# Patient Record
Sex: Female | Born: 1960 | Race: White | Hispanic: No | Marital: Married | Smoking: Never smoker
Health system: Southern US, Community
[De-identification: ages and names within clinical notes are randomized; demographics above are authoritative.]

## PROBLEM LIST (undated history)

## (undated) DIAGNOSIS — K649 Unspecified hemorrhoids: Secondary | ICD-10-CM

## (undated) DIAGNOSIS — B019 Varicella without complication: Secondary | ICD-10-CM

## (undated) DIAGNOSIS — R87610 Atypical squamous cells of undetermined significance on cytologic smear of cervix (ASC-US): Secondary | ICD-10-CM

## (undated) DIAGNOSIS — M1990 Unspecified osteoarthritis, unspecified site: Secondary | ICD-10-CM

## (undated) DIAGNOSIS — O24419 Gestational diabetes mellitus in pregnancy, unspecified control: Secondary | ICD-10-CM

## (undated) DIAGNOSIS — G43909 Migraine, unspecified, not intractable, without status migrainosus: Secondary | ICD-10-CM

## (undated) DIAGNOSIS — Z9289 Personal history of other medical treatment: Secondary | ICD-10-CM

## (undated) DIAGNOSIS — Z98891 History of uterine scar from previous surgery: Secondary | ICD-10-CM

## (undated) DIAGNOSIS — N839 Noninflammatory disorder of ovary, fallopian tube and broad ligament, unspecified: Secondary | ICD-10-CM

## (undated) DIAGNOSIS — N926 Irregular menstruation, unspecified: Secondary | ICD-10-CM

## (undated) HISTORY — DX: Unspecified hemorrhoids: K64.9

## (undated) HISTORY — DX: Migraine, unspecified, not intractable, without status migrainosus: G43.909

## (undated) HISTORY — DX: Noninflammatory disorder of ovary, fallopian tube and broad ligament, unspecified: N83.9

## (undated) HISTORY — DX: Varicella without complication: B01.9

## (undated) HISTORY — DX: Atypical squamous cells of undetermined significance on cytologic smear of cervix (ASC-US): R87.610

## (undated) HISTORY — DX: History of uterine scar from previous surgery: Z98.891

## (undated) HISTORY — DX: Irregular menstruation, unspecified: N92.6

## (undated) HISTORY — DX: Gestational diabetes mellitus in pregnancy, unspecified control: O24.419

## (undated) HISTORY — DX: Personal history of other medical treatment: Z92.89

## (undated) HISTORY — DX: Unspecified osteoarthritis, unspecified site: M19.90

---

## 2000-08-20 HISTORY — PX: TUBAL LIGATION: SHX77

## 2011-08-21 DIAGNOSIS — N839 Noninflammatory disorder of ovary, fallopian tube and broad ligament, unspecified: Secondary | ICD-10-CM

## 2011-08-21 HISTORY — DX: Noninflammatory disorder of ovary, fallopian tube and broad ligament, unspecified: N83.9

## 2012-01-02 ENCOUNTER — Ambulatory Visit: Payer: Self-pay

## 2012-06-24 ENCOUNTER — Ambulatory Visit: Payer: Self-pay | Admitting: Obstetrics and Gynecology

## 2012-06-24 LAB — CBC
HGB: 12.6 g/dL (ref 12.0–16.0)
MCH: 33 pg (ref 26.0–34.0)
MCV: 95 fL (ref 80–100)
Platelet: 263 10*3/uL (ref 150–440)
RBC: 3.83 10*6/uL (ref 3.80–5.20)

## 2012-06-24 LAB — BASIC METABOLIC PANEL
Anion Gap: 6 — ABNORMAL LOW (ref 7–16)
BUN: 20 mg/dL — ABNORMAL HIGH (ref 7–18)
Chloride: 109 mmol/L — ABNORMAL HIGH (ref 98–107)
Co2: 27 mmol/L (ref 21–32)
Creatinine: 0.68 mg/dL (ref 0.60–1.30)
EGFR (Non-African Amer.): >60
Osmolality: 285 (ref 275–301)
Potassium: 3.8 mmol/L (ref 3.5–5.1)
Sodium: 142 mmol/L (ref 136–145)

## 2012-07-01 ENCOUNTER — Ambulatory Visit: Payer: Self-pay | Admitting: Obstetrics and Gynecology

## 2012-07-01 HISTORY — PX: ABDOMINAL HYSTERECTOMY: SHX81

## 2012-07-02 LAB — BASIC METABOLIC PANEL
Chloride: 110 mmol/L — ABNORMAL HIGH (ref 98–107)
Co2: 25 mmol/L (ref 21–32)
Creatinine: 0.74 mg/dL (ref 0.60–1.30)
EGFR (Non-African Amer.): >60
Glucose: 97 mg/dL (ref 65–99)
Potassium: 3.4 mmol/L — ABNORMAL LOW (ref 3.5–5.1)
Sodium: 141 mmol/L (ref 136–145)

## 2012-07-02 LAB — HEMATOCRIT: HCT: 32 % — ABNORMAL LOW (ref 35.0–47.0)

## 2013-06-20 DIAGNOSIS — K649 Unspecified hemorrhoids: Secondary | ICD-10-CM

## 2013-06-20 HISTORY — DX: Unspecified hemorrhoids: K64.9

## 2013-06-20 HISTORY — PX: COLONOSCOPY: SHX174

## 2014-04-29 DIAGNOSIS — R87610 Atypical squamous cells of undetermined significance on cytologic smear of cervix (ASC-US): Secondary | ICD-10-CM

## 2014-04-29 HISTORY — DX: Atypical squamous cells of undetermined significance on cytologic smear of cervix (ASC-US): R87.610

## 2014-12-07 NOTE — Op Note (Signed)
PATIENT NAME:  Kelly Welch, Kelly Welch MR#:  [id number] DATE OF BIRTH:  February 01, 1961  DATE OF PROCEDURE:  07/01/2012  PREOPERATIVE DIAGNOSIS: Severe pain and bleeding unresponsive to medical management.   POSTOPERATIVE DIAGNOSES:  1. Severe pain and bleeding unresponsive to medical management. 2. Adhesions on the left lateral sidewall.   ESTIMATED BLOOD LOSS: 100 mL.   SURGEON: Elliot Gurney, MD  ASSISTANT: Annamarie Major, M.D.   FINDINGS: Approximately 12 week size uterus, spongy, with bilateral tubal ligation with the left tubal ligation wrapped around the ovary and adhered to the left side wall with what appeared to be old endometriosis scarring on the left lateral uterosacral ligaments.   DESCRIPTION OF PROCEDURE: Patient was taken to the Operating Room and placed in supine position. After adequate general endotracheal anesthesia was instilled, the patient was prepped and draped in the usual sterile fashion. Timeout was performed. Side-opening speculum was placed in the patient's vagina and the anterior lip of the cervix was grasped with a single-tooth tenaculum. Hulka tenaculum was placed. Single-tooth tenaculum was removed. Side-opening speculum was removed. Foley catheter had been placed. The umbilicus was injected with Marcaine. Incision was made through the umbilical folds. Veress needle was placed. Hang drop test, fluid instillation test and fluid aspiration test showed proper placement of the Veress needle. CO2 was placed on low flow. When tympany was heard around the liver CO2 was placed on high flow. VERESS needle was removed when 15 mmHg was placed into the abdomen and the Xcel trocar was placed into the umbilicus under direct visualization. The patient was placed in Trendelenburg and the two lower port 10 mm trocars were placed. The aforementioned findings were seen. Photographs were taken. 10 mm trocar ports were placed bilaterally and Harmonic scalpel was placed into the abdomen. The grasper  was used to hold back the tube. First the left tube was released from the ovary and the sidewall and then serial bites were taken down the sides of the uterus, the utero-ovarian ligament to the round ligament down to the bladder. Bladder flap was created. The Kleppinger was then used to cauterize the uterine artery. Bladder flap was created. There was excessive scarring and fat tissue from the previous C-section up onto the bladder and the side walls of the left side of the uterus. Attention was then turned to the right side where the tube was detached from the peritoneum. The utero-ovarian ligament was cut with serial bites of the Harmonic scalpel. The round ligament was cut. Serial bites taken down the side of the right uterus. Kleppinger was used to cauterize the right uterine artery. The bladder flap was made a little bit more pronounced and pushed off of the cervix approximately 1 cm. The uterosacral ligaments are identified and the Harmonic scalpel was then used to cut across the cervix above the uterosacral ligament for support. Good hemostasis was identified. One small area of bleeding was identified in the left area of the bladder attachment to the fat. Sponge stick was placed in the vagina and the vagina was pushed up to identify the source of the bleeding. This was then cauterized with the Kleppinger. Indigo carmine was given to the patient. The uterus was then morcellated. All pieces were removed from the abdomen. The pelvis was irrigated with copious amounts of warm normal saline. Good hemostasis identified. The cervical opening was cauterized with the Kleppinger. Interceed was then placed across the base of the cervix. The patient was taken out of Trendelenburg. Bowels were allowed to  fall back into the pelvis and hold the Interceed in place. CO2 was allowed to escape from the patient's abdomen after the cone had been used to tie the fascial suture on the patient's left side where the morcellator had  gone to. UR-6 were used for the deep suture of the fascia to approximate the other incisions. A 4-0 Monocryl was used to approximate the skin edges. Dermabond was placed. Tegaderm was placed. Sponge stick was removed from the patient's vagina with the other instruments. Blue urine was noted in the Foley bag and not in the belly. The patient was then laid supine and taken to recovery after having tolerated the procedure well.   ____________________________ Elliot Gurneyarrie C. Earnstine Meinders, MD cck:cms D: 07/05/2012 12:26:15 ET T: 07/05/2012 14:12:53 ET JOB#: [id number]  cc: Elliot Gurneyarrie C. Anthonyjames Bargar, MD, <Dictator> Elliot GurneyARRIE C Einar Nolasco MD ELECTRONICALLY SIGNED 07/10/2012 15:46

## 2016-08-28 ENCOUNTER — Other Ambulatory Visit: Payer: Self-pay | Admitting: Obstetrics and Gynecology

## 2016-08-28 DIAGNOSIS — Z1231 Encounter for screening mammogram for malignant neoplasm of breast: Secondary | ICD-10-CM

## 2016-10-01 ENCOUNTER — Ambulatory Visit: Payer: Self-pay

## 2016-11-13 ENCOUNTER — Ambulatory Visit
Admission: RE | Admit: 2016-11-13 | Discharge: 2016-11-13 | Disposition: A | Discharge status: Home / Self Care | Payer: Managed Care, Other (non HMO) | Source: Ambulatory Visit | Attending: Obstetrics and Gynecology | Admitting: Obstetrics and Gynecology

## 2016-11-13 DIAGNOSIS — Z1231 Encounter for screening mammogram for malignant neoplasm of breast: Secondary | ICD-10-CM | POA: Diagnosis present

## 2016-11-15 ENCOUNTER — Inpatient Hospital Stay
Admission: RE | Admit: 2016-11-15 | Discharge: 2016-11-15 | Disposition: A | Discharge status: Home / Self Care | Payer: Self-pay | Source: Ambulatory Visit | Attending: *Deleted | Admitting: *Deleted

## 2016-11-15 ENCOUNTER — Other Ambulatory Visit: Payer: Self-pay | Admitting: *Deleted

## 2016-11-15 DIAGNOSIS — Z9289 Personal history of other medical treatment: Secondary | ICD-10-CM

## 2016-11-25 ENCOUNTER — Encounter: Payer: Self-pay | Admitting: Obstetrics and Gynecology

## 2017-04-29 ENCOUNTER — Other Ambulatory Visit: Payer: Self-pay | Admitting: Obstetrics and Gynecology

## 2017-05-15 ENCOUNTER — Other Ambulatory Visit: Payer: Self-pay | Admitting: Obstetrics and Gynecology

## 2017-05-16 ENCOUNTER — Other Ambulatory Visit: Payer: Self-pay | Admitting: Obstetrics and Gynecology

## 2017-05-16 MED ORDER — FLUTICASONE PROPIONATE 50 MCG/ACT NA SUSP: NASAL | 0 refills | Status: DC

## 2017-05-16 NOTE — Progress Notes (Signed)
Reorder since pharm says they didn't get Rx.

## 2017-05-20 ENCOUNTER — Other Ambulatory Visit: Payer: Self-pay

## 2017-05-20 MED ORDER — FLUTICASONE PROPIONATE 50 MCG/ACT NA SUSP: NASAL | 0 refills | Status: DC

## 2017-05-27 ENCOUNTER — Encounter: Payer: Self-pay | Admitting: Obstetrics and Gynecology

## 2017-05-27 ENCOUNTER — Ambulatory Visit (INDEPENDENT_AMBULATORY_CARE_PROVIDER_SITE_OTHER): Payer: Managed Care, Other (non HMO) | Admitting: Obstetrics and Gynecology

## 2017-05-27 VITALS — BP 126/84 | HR 80 | Ht 62.0 in | Wt 186.0 lb

## 2017-05-27 DIAGNOSIS — F419 Anxiety disorder, unspecified: Secondary | ICD-10-CM | POA: Diagnosis not present

## 2017-05-27 MED ORDER — LORAZEPAM 0.5 MG PO TABS: ORAL_TABLET | ORAL | 0 refills | Status: DC

## 2017-05-27 NOTE — Addendum Note (Signed)
Addended by: Althea Grimmer B on: 05/27/2017 12:15 PM   Modules accepted: Level of Service

## 2017-05-27 NOTE — Progress Notes (Addendum)
Chief Complaint  Patient presents with  . Anxiety    HPI:      [name] is a 56 y.o. [phone] who LMP was No LMP recorded. Patient has had a hysterectomy., presents today for recent anxiety due to family health issues. 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 She has trouble sleeping due to worry and taking care of her mom's fragile DM. Sx increased for about a month.  She took prozac intermittently in the past when kids were young, but no real hx of anxiety/depression. Pt is trying to manage sx with prayer/exercise, but just feels overwhelmed some days. She would like something for prn use. No SI.   Past Medical History:  Diagnosis Date  . ASCUS of cervix with negative high risk HPV 04/29/2014  . Fallopian tube disorder 2013   focal epithelial hyperplasia of the tubes on pathology  . Gestational diabetes   . H/O cesarean section 93; 99; 02   x3  . Hemorrhoids 06/2013   internal per colonoscopy  . History of mammogram 9/15/15l 07/20/15   birad 2; benign  . History of Papanicolaou smear of cervix 04/29/14; 07/20/15   ascus/neg; neg/neg  . Irregular menses   . Migraine     Past Surgical History:  Procedure Laterality Date  . ABDOMINAL HYSTERECTOMY  07/01/2012   LSH C BIL TUBES REMOVED  . CESAREAN SECTION     93; 99; 02  . COLONOSCOPY  06/2013   DR. ELLIOTT; INTERNAL HEMORRHOIDS; RECK IN 5HRS D/T FAM HX  . TUBAL LIGATION  2002    Family History  Problem Relation Age of Onset  . Diabetes Mother   . Hypertension Mother   . Diabetes Father   . Heart disease Father   . Hypertension Father     Social History   Social History  . Marital status: Married    Spouse name: N/A  . Number of children: 3  . Years of education: 14   Occupational History  . HOMEMAKER    Social History Main Topics  . Smoking status: Never Smoker  . Smokeless tobacco: Never Used  . Alcohol use Yes   Comment: OCC  . Drug use: No  . Sexual activity: Yes    Birth control/ protection: Post-menopausal   Other Topics Concern  . Not on file   Social History Narrative  . No narrative on file     Current Outpatient Prescriptions:  .  Cholecalciferol (VITAMIN D3) 2000 units TABS, Take 1 tablet by mouth daily., Disp: , Rfl:  .  estradiol (VIVELLE-DOT) 0.0375 MG/24HR, APPLY ONE PATCH TWICE WEEKLY, Disp: 24 patch, Rfl: 1 .  fluticasone (FLONASE) 50 MCG/ACT nasal spray, USE 2 SPRAYS IN EACH NOSTRIL DAILY AS NEEDED, Disp: 48 g, Rfl: 0 .  hydrochlorothiazide (HYDRODIURIL) 25 MG tablet, Take 25 mg by mouth daily., Disp: , Rfl:  .  SUPER B COMPLEX/C PO, Take by mouth., Disp: , Rfl:  .  LORazepam (ATIVAN) 0.5 MG tablet, Take 1-2 tabs BID prn sx, Disp: 30 tablet, Rfl: 0   ROS:  Review of Systems  Constitutional: Negative for fever.  Gastrointestinal: Positive for nausea. Negative for blood in stool, constipation, diarrhea and vomiting.  Genitourinary: Negative for dyspareunia, dysuria, flank pain, frequency, hematuria, urgency, vaginal bleeding, vaginal discharge and vaginal pain.  Musculoskeletal: Negative for back pain.  Skin: Negative for rash.  Neurological: Positive for headaches.  Psychiatric/Behavioral:  Positive for agitation and sleep disturbance. Negative for dysphoric mood, hallucinations, self-injury and suicidal ideas.     OBJECTIVE:   Vitals:  BP 126/84 (BP Location: Left Arm, Patient Position: Sitting, Cuff Size: Large)   Pulse 80   Ht  (1.575 m)   Wt 186 lb (84.4 kg)   BMI 34.02 kg/m   Physical Exam  Constitutional: She is oriented to person, place, and time and well-developed, well-nourished, and in no distress.  Neurological: She is alert and oriented to person, place, and time.  Psychiatric: Memory, affect and judgment normal.  Vitals reviewed.   Results:  GAD 7 : Generalized Anxiety Score 05/27/2017  Nervous, Anxious, on Edge 1  Control/stop worrying 2    Worry too much - different things 2  Trouble relaxing 1  Restless 1  Easily annoyed or irritable 1  Afraid - awful might happen 2  Total GAD 7 Score 10  Anxiety Difficulty Somewhat difficult   Depression screen PHQ 2/9 05/27/2017  Decreased Interest 0  Down, Depressed, Hopeless 1  PHQ - 2 Score 1  Altered sleeping 2  Tired, decreased energy 1  Change in appetite 1  Feeling bad or failure about yourself  0  Trouble concentrating 1  Moving slowly or fidgety/restless 0  Suicidal thoughts 0  PHQ-9 Score 6  Difficult doing work/chores Somewhat difficult    Assessment/Plan: Anxiety - Increased recently, due to current stressors. Rx ativan. Use sparingly. F/u prn. Cont exercise/prayer. - Plan: LORazepam (ATIVAN) 0.5 MG tablet  Annual due 12/18--can f/u then, too.  Meds ordered this encounter  Medications  . LORazepam (ATIVAN) 0.5 MG tablet    Sig: Take 1-2 tabs BID prn sx    Dispense:  30 tablet    Refill:  0      Return if symptoms worsen or fail to improve.  Orien Mayhall B. Cam Dauphin, PA-C 05/27/2017 12:15 PM

## 2017-07-31 ENCOUNTER — Other Ambulatory Visit: Payer: Self-pay | Admitting: Obstetrics and Gynecology

## 2017-08-28 ENCOUNTER — Ambulatory Visit (INDEPENDENT_AMBULATORY_CARE_PROVIDER_SITE_OTHER): Payer: Managed Care, Other (non HMO) | Admitting: Obstetrics and Gynecology

## 2017-08-28 ENCOUNTER — Encounter: Payer: Self-pay | Admitting: Obstetrics and Gynecology

## 2017-08-28 VITALS — BP 130/82 | HR 62 | Ht 62.0 in | Wt 192.0 lb

## 2017-08-28 DIAGNOSIS — Z1239 Encounter for other screening for malignant neoplasm of breast: Secondary | ICD-10-CM

## 2017-08-28 DIAGNOSIS — Z9109 Other allergy status, other than to drugs and biological substances: Secondary | ICD-10-CM | POA: Diagnosis not present

## 2017-08-28 DIAGNOSIS — G8929 Other chronic pain: Secondary | ICD-10-CM

## 2017-08-28 DIAGNOSIS — Z1231 Encounter for screening mammogram for malignant neoplasm of breast: Secondary | ICD-10-CM

## 2017-08-28 DIAGNOSIS — Z79890 Hormone replacement therapy (postmenopausal): Secondary | ICD-10-CM | POA: Diagnosis not present

## 2017-08-28 DIAGNOSIS — M5442 Lumbago with sciatica, left side: Secondary | ICD-10-CM

## 2017-08-28 DIAGNOSIS — Z1211 Encounter for screening for malignant neoplasm of colon: Secondary | ICD-10-CM

## 2017-08-28 DIAGNOSIS — Z13220 Encounter for screening for lipoid disorders: Secondary | ICD-10-CM | POA: Diagnosis not present

## 2017-08-28 DIAGNOSIS — R600 Localized edema: Secondary | ICD-10-CM

## 2017-08-28 DIAGNOSIS — Z131 Encounter for screening for diabetes mellitus: Secondary | ICD-10-CM | POA: Diagnosis not present

## 2017-08-28 DIAGNOSIS — Z01419 Encounter for gynecological examination (general) (routine) without abnormal findings: Secondary | ICD-10-CM

## 2017-08-28 DIAGNOSIS — N951 Menopausal and female climacteric states: Secondary | ICD-10-CM

## 2017-08-28 DIAGNOSIS — Z0000 Encounter for general adult medical examination without abnormal findings: Secondary | ICD-10-CM | POA: Diagnosis not present

## 2017-08-28 DIAGNOSIS — F419 Anxiety disorder, unspecified: Secondary | ICD-10-CM | POA: Diagnosis not present

## 2017-08-28 MED ORDER — FLUTICASONE PROPIONATE 50 MCG/ACT NA SUSP: 2.0000 | Freq: Every day | NASAL | 1 refills | Status: DC

## 2017-08-28 MED ORDER — ESTRADIOL 0.0375 MG/24HR TD PTTW: MEDICATED_PATCH | TRANSDERMAL | 4 refills | Status: DC

## 2017-08-28 MED ORDER — HYDROCHLOROTHIAZIDE 25 MG PO TABS: ORAL_TABLET | ORAL | 3 refills | Status: DC

## 2017-08-28 NOTE — Progress Notes (Signed)
PCP: Patient, No Pcp Per   Chief Complaint  Patient presents with  . Gynecologic Exam    HPI:      [name] is a 57 y.o. [phone] who LMP was No LMP recorded. Patient has had a hysterectomy., presents today for her annual examination.  Her menses are absent due to [facility] with bilat salpingectomy 2013 with CCK due to DUB. She does not have intermenstrual bleeding.  She does not have vasomotor sx. She uses vivelle dot 0.0375 mg with sx relief. She would like to cont ERT.  Sex activity: single partner, contraception - status post hysterectomy. She does not have vaginal dryness.  Last Pap: July 20, 2015  Results were: no abnormalities /neg HPV DNA.  Hx of STDs: none  Last mammogram: November 13, 2016  Results were: normal--routine follow-up in 12 months There is no FH of breast cancer. There is no FH of ovarian cancer. The patient does do self-breast exams.  Colonoscopy: colonoscopy 5 years ago without abnormalities. .Repeat due after 5 years due to FH of polyps.   Tobacco use: The patient denies current or previous tobacco use. Alcohol use: social drinker Exercise: moderately active  She does get adequate calcium but stopped her Vitamin D supp in her diet.  Borderline lipids 2016 and 12/17. Pre-DM on 12/17 labs (HgA1C=5.7%), hx of gestational DM. Due for repeat lab this yr.   Pt also with anxiety issues, seen 10/18 for increased sx due to family health stressors. 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 Pt didn't need daily meds at that time and was given ativan. She never took it. Her family is saying she is overly anxious with worry and "knit picking" sometimes. Pt doesn't think it's daily and is trying to work on it.  She needs Rx RF on flonase for allergies and HCTZ for LE edema. She has been on that for many yrs. She takes 25 mg daily but I suggested last yr she try 1/2 tab since on lower ERT dose now. She exercises regularly.  She notes LBP with LT leg pain  and RT great toe pain. She was seeing chiro for back pain with sx relief but got too expensive. She is using ice/NSAIDs for toe.  Doesn't have PCP.   Past Medical History:  Diagnosis Date  . ASCUS of cervix with negative high risk HPV 04/29/2014  . Fallopian tube disorder 2013   focal epithelial hyperplasia of the tubes on pathology  . Gestational diabetes   . H/O cesarean section 93; 99; 02   x3  . Hemorrhoids 06/2013   internal per colonoscopy  . History of mammogram 9/15/15l 07/20/15   birad 2; benign  . History of Papanicolaou smear of cervix 04/29/14; 07/20/15   ascus/neg; neg/neg  . Irregular menses   . Migraine     Past Surgical History:  Procedure Laterality Date  . ABDOMINAL HYSTERECTOMY  07/01/2012   LSH C BIL TUBES REMOVED  . CESAREAN SECTION     93; 99; 02  . COLONOSCOPY  06/2013   DR. ELLIOTT; INTERNAL HEMORRHOIDS; RECK IN 5HRS D/T FAM HX  . TUBAL LIGATION  2002    Family History  Problem Relation Age of Onset  . Diabetes Mother   . Hypertension Mother   . Diabetes Father   . Heart disease Father   . Hypertension Father     Social History   Socioeconomic History  . Marital status: Married    Spouse name:  Not on file  . Number of children: 3  . Years of education: 41  . Highest education level: Not on file  Social Needs  . Financial resource strain: Not on file  . Food insecurity - worry: Not on file  . Food insecurity - inability: Not on file  . Transportation needs - medical: Not on file  . Transportation needs - non-medical: Not on file  Occupational History  . Occupation: HOMEMAKER  Tobacco Use  . Smoking status: Never Smoker  . Smokeless tobacco: Never Used  Substance and Sexual Activity  . Alcohol use: Yes    Comment: OCC  . Drug use: No  . Sexual activity: Yes    Birth control/protection: Post-menopausal  Other Topics Concern  . Not on file  Social History Narrative  . Not on file    Current Meds  Medication Sig  . estradiol  (VIVELLE-DOT) 0.0375 MG/24HR APPLY ONE PATCH TWICE WEEKLY  . fluticasone (FLONASE) 50 MCG/ACT nasal spray Place 2 sprays into both nostrils daily.  . hydrochlorothiazide (HYDRODIURIL) 25 MG tablet Take 1/2 to 1 tab daily  . [DISCONTINUED] estradiol (VIVELLE-DOT) 0.0375 MG/24HR APPLY ONE PATCH TWICE WEEKLY  . [DISCONTINUED] fluticasone (FLONASE) 50 MCG/ACT nasal spray USE 2 SPRAYS IN EACH NOSTRIL DAILY AS NEEDED  . [DISCONTINUED] hydrochlorothiazide (HYDRODIURIL) 25 MG tablet Take 25 mg by mouth daily.      ROS:  Review of Systems  Constitutional: Negative for fatigue, fever and unexpected weight change.  Respiratory: Negative for cough, shortness of breath and wheezing.   Cardiovascular: Negative for chest pain, palpitations and leg swelling.  Gastrointestinal: Negative for blood in stool, constipation, diarrhea, nausea and vomiting.  Endocrine: Negative for cold intolerance, heat intolerance and polyuria.  Genitourinary: Negative for dyspareunia, dysuria, flank pain, frequency, genital sores, hematuria, menstrual problem, pelvic pain, urgency, vaginal bleeding, vaginal discharge and vaginal pain.  Musculoskeletal: Positive for arthralgias and back pain. Negative for joint swelling and myalgias.  Skin: Negative for rash.  Neurological: Negative for dizziness, syncope, light-headedness, numbness and headaches.  Hematological: Negative for adenopathy.  Psychiatric/Behavioral: Positive for agitation. Negative for confusion, sleep disturbance and suicidal ideas. The patient is not nervous/anxious.      Objective: BP 130/82 (BP Location: Left Arm, Patient Position: Sitting, Cuff Size: Normal)   Pulse 62   Ht 5\' 2"  (1.575 m)   Wt 192 lb (87.1 kg)   BMI 35.12 kg/m    Physical Exam  Constitutional: She is oriented to person, place, and time. She appears well-developed and well-nourished.  Genitourinary: Vagina normal and uterus normal. There is no rash or tenderness on the right labia.  There is no rash or tenderness on the left labia. No erythema or tenderness in the vagina. No vaginal discharge found. Right adnexum does not display mass and does not display tenderness. Left adnexum does not display mass and does not display tenderness. Cervix does not exhibit motion tenderness or polyp. Uterus is not enlarged or tender.  Genitourinary Comments: UTERUS SURG REM  Neck: Normal range of motion. No thyromegaly present.  Cardiovascular: Normal rate, regular rhythm and normal heart sounds.  No murmur heard. Pulmonary/Chest: Effort normal and breath sounds normal. Right breast exhibits no mass, no nipple discharge, no skin change and no tenderness. Left breast exhibits no mass, no nipple discharge, no skin change and no tenderness.  Abdominal: Soft. There is no tenderness. There is no guarding.  Musculoskeletal: Normal range of motion.  Neurological: She is alert and oriented to person, place, and  time. No cranial nerve deficit.  Psychiatric: She has a normal mood and affect. Her behavior is normal.  Vitals reviewed.   Assessment/Plan:  Encounter for annual routine gynecological examination  Screening for breast cancer - Pt to sched mammo. - Plan: MM DIGITAL SCREENING BILATERAL  Blood tests for routine general physical examination - Plan: Comprehensive metabolic panel, Lipid panel, Hemoglobin A1c, Hemoglobin A1c, Lipid panel, Comprehensive metabolic panel  Screening cholesterol level - Plan: Lipid panel, Lipid panel  Screening for diabetes mellitus - Plan: Hemoglobin A1c, Hemoglobin A1c  Hormone replacement therapy (HRT) - Plan: estradiol (VIVELLE-DOT) 0.0375 MG/24HR  Vasomotor symptoms due to menopause - Rx RF vivelle dot 0.0375 mg.  - Plan: estradiol (VIVELLE-DOT) 0.0375 MG/24HR  Chronic bilateral low back pain with left-sided sciatica - Refer to PCP. Stretch/ice/chiro/exercise  Lower leg edema - Rx RF HCTZ. Try 1/2 tab (12.5 mg dose) to see if sx controlled. Cont  exercise/minimize sodium intake. - Plan: hydrochlorothiazide (HYDRODIURIL) 25 MG tablet  Environmental allergies - Rx RF flonase. - Plan: fluticasone (FLONASE) 50 MCG/ACT nasal spray  Anxiety - Try ativan episodically and sparingly. If sx are more daily, will try SSRI. Pt to f/u prn. Cont exercise/prayer.  Screening for colon cancer - Pt due for colonoscopy f/u this yr. Pt to sched wtih DR. Elliott. Will do ref prn.   Meds ordered this encounter  Medications  . estradiol (VIVELLE-DOT) 0.0375 MG/24HR    Sig: APPLY ONE PATCH TWICE WEEKLY    Dispense:  24 patch    Refill:  4  . fluticasone (FLONASE) 50 MCG/ACT nasal spray    Sig: Place 2 sprays into both nostrils daily.    Dispense:  16 g    Refill:  1  . hydrochlorothiazide (HYDRODIURIL) 25 MG tablet    Sig: Take 1/2 to 1 tab daily    Dispense:  90 tablet    Refill:  3           GYN counsel breast self exam, mammography screening, menopause, adequate intake of calcium and vitamin D, diet and exercise    F/U  Return in about 1 year (around 08/28/2018).  Quashaun Lazalde B. Raynetta Osterloh, PA-C 08/28/2017 10:51 AM

## 2017-08-28 NOTE — Patient Instructions (Addendum)
I value your feedback and entrusting us with your care. If you get a [facility] patient survey, I would appreciate you taking the time to let us know about your experience today. Thank you!  [facility]: [phone]    

## 2017-09-06 ENCOUNTER — Other Ambulatory Visit: Payer: Managed Care, Other (non HMO)

## 2017-09-07 LAB — COMPREHENSIVE METABOLIC PANEL
ALBUMIN: 4.7 g/dL (ref 3.5–5.5)
ALT: 15 IU/L (ref 0–32)
AST: 20 IU/L (ref 0–40)
Albumin/Globulin Ratio: 1.7 (ref 1.2–2.2)
Alkaline Phosphatase: 56 IU/L (ref 39–117)
BILIRUBIN TOTAL: 0.5 mg/dL (ref 0.0–1.2)
BUN / CREAT RATIO: 27 — AB (ref 9–23)
BUN: 17 mg/dL (ref 6–24)
CHLORIDE: 100 mmol/L (ref 96–106)
CO2: 23 mmol/L (ref 20–29)
Calcium: 9.9 mg/dL (ref 8.7–10.2)
Creatinine, Ser: 0.64 mg/dL (ref 0.57–1.00)
GFR calc Af Amer: 116 mL/min/{1.73_m2} (ref >59)
GFR calc non Af Amer: 101 mL/min/{1.73_m2} (ref >59)
GLUCOSE: 99 mg/dL (ref 65–99)
Globulin, Total: 2.8 g/dL (ref 1.5–4.5)
Potassium: 4.8 mmol/L (ref 3.5–5.2)
Sodium: 140 mmol/L (ref 134–144)
Total Protein: 7.5 g/dL (ref 6.0–8.5)

## 2017-09-07 LAB — LIPID PANEL
CHOLESTEROL TOTAL: 219 mg/dL — AB (ref 100–199)
Chol/HDL Ratio: 3.2 ratio (ref 0.0–4.4)
HDL: 68 mg/dL (ref >39)
LDL CALC: 129 mg/dL — AB (ref 0–99)
TRIGLYCERIDES: 112 mg/dL (ref 0–149)
VLDL Cholesterol Cal: 22 mg/dL (ref 5–40)

## 2017-09-07 LAB — HEMOGLOBIN A1C
ESTIMATED AVERAGE GLUCOSE: 114 mg/dL
Hgb A1c MFr Bld: 5.6 % (ref 4.8–5.6)

## 2017-10-31 ENCOUNTER — Other Ambulatory Visit: Payer: Self-pay | Admitting: Obstetrics and Gynecology

## 2017-10-31 DIAGNOSIS — Z9109 Other allergy status, other than to drugs and biological substances: Secondary | ICD-10-CM

## 2017-11-19 ENCOUNTER — Ambulatory Visit
Admission: RE | Admit: 2017-11-19 | Discharge: 2017-11-19 | Disposition: A | Discharge status: Home / Self Care | Payer: Managed Care, Other (non HMO) | Source: Ambulatory Visit | Attending: Obstetrics and Gynecology | Admitting: Obstetrics and Gynecology

## 2017-11-19 DIAGNOSIS — Z1239 Encounter for other screening for malignant neoplasm of breast: Secondary | ICD-10-CM

## 2017-11-19 DIAGNOSIS — Z1231 Encounter for screening mammogram for malignant neoplasm of breast: Secondary | ICD-10-CM | POA: Insufficient documentation

## 2017-11-20 ENCOUNTER — Other Ambulatory Visit: Payer: Self-pay | Admitting: Obstetrics and Gynecology

## 2017-11-20 DIAGNOSIS — R928 Other abnormal and inconclusive findings on diagnostic imaging of breast: Secondary | ICD-10-CM

## 2017-11-20 DIAGNOSIS — N6489 Other specified disorders of breast: Secondary | ICD-10-CM

## 2017-11-28 ENCOUNTER — Ambulatory Visit
Admission: RE | Admit: 2017-11-28 | Discharge: 2017-11-28 | Disposition: A | Discharge status: Home / Self Care | Payer: Managed Care, Other (non HMO) | Source: Ambulatory Visit | Attending: Obstetrics and Gynecology | Admitting: Obstetrics and Gynecology

## 2017-11-28 ENCOUNTER — Encounter: Payer: Self-pay | Admitting: Obstetrics and Gynecology

## 2017-11-28 DIAGNOSIS — N6489 Other specified disorders of breast: Secondary | ICD-10-CM | POA: Insufficient documentation

## 2017-11-28 DIAGNOSIS — N6001 Solitary cyst of right breast: Secondary | ICD-10-CM | POA: Diagnosis not present

## 2017-11-28 DIAGNOSIS — R928 Other abnormal and inconclusive findings on diagnostic imaging of breast: Secondary | ICD-10-CM | POA: Diagnosis present

## 2017-12-27 ENCOUNTER — Telehealth: Payer: Self-pay

## 2017-12-27 NOTE — Telephone Encounter (Signed)
Pt calling triage states that she would like her Ativan sent in [facility] pharmacy please.

## 2017-12-30 ENCOUNTER — Other Ambulatory Visit: Payer: Self-pay | Admitting: Obstetrics and Gynecology

## 2017-12-30 DIAGNOSIS — F419 Anxiety disorder, unspecified: Secondary | ICD-10-CM

## 2017-12-30 MED ORDER — LORAZEPAM 0.5 MG PO TABS: ORAL_TABLET | ORAL | 0 refills | Status: DC

## 2017-12-30 NOTE — Telephone Encounter (Signed)
RN to fax Rx and notify pt.

## 2017-12-30 NOTE — Telephone Encounter (Signed)
Pt aware.

## 2017-12-30 NOTE — Progress Notes (Signed)
Rx RF. 

## 2018-09-17 ENCOUNTER — Other Ambulatory Visit: Payer: Self-pay | Admitting: Obstetrics and Gynecology

## 2018-09-17 ENCOUNTER — Telehealth: Payer: Self-pay

## 2018-09-17 DIAGNOSIS — R600 Localized edema: Secondary | ICD-10-CM

## 2018-09-17 DIAGNOSIS — N951 Menopausal and female climacteric states: Secondary | ICD-10-CM

## 2018-09-17 DIAGNOSIS — Z79890 Hormone replacement therapy (postmenopausal): Secondary | ICD-10-CM

## 2018-09-17 DIAGNOSIS — Z9109 Other allergy status, other than to drugs and biological substances: Secondary | ICD-10-CM

## 2018-09-17 MED ORDER — FLUTICASONE PROPIONATE 50 MCG/ACT NA SUSP: 2.0000 | Freq: Every day | NASAL | 2 refills | Status: DC

## 2018-09-17 MED ORDER — HYDROCHLOROTHIAZIDE 25 MG PO TABS: ORAL_TABLET | ORAL | 0 refills | Status: DC

## 2018-09-17 MED ORDER — ESTRADIOL 0.0375 MG/24HR TD PTTW: MEDICATED_PATCH | TRANSDERMAL | 0 refills | Status: DC

## 2018-09-17 NOTE — Telephone Encounter (Signed)
Pt's rx plan has changed; now is [facility];  Please send HCTZ, Hormone patch and nose spray to [facility].  [phone]

## 2018-09-17 NOTE — Progress Notes (Signed)
Rx RF HCTZ, flonase, and [facility]radiol [facility]. Annual sched

## 2018-09-17 NOTE — Telephone Encounter (Signed)
Please advise 

## 2018-09-17 NOTE — Telephone Encounter (Signed)
Annual scheduled 09/29/18, pharmacy has been updated in chart.

## 2018-09-17 NOTE — Telephone Encounter (Signed)
Pls let pt know she is past due for annual and we can send in RF once she sched appt. Pls make sure pharm info changed in chart. Thx.

## 2018-09-29 ENCOUNTER — Encounter: Payer: Self-pay | Admitting: Obstetrics and Gynecology

## 2018-09-29 ENCOUNTER — Other Ambulatory Visit (HOSPITAL_COMMUNITY)
Admission: RE | Admit: 2018-09-29 | Discharge: 2018-09-29 | Disposition: A | Discharge status: Home / Self Care | Payer: 59 | Source: Ambulatory Visit | Attending: Obstetrics and Gynecology | Admitting: Obstetrics and Gynecology

## 2018-09-29 ENCOUNTER — Ambulatory Visit (INDEPENDENT_AMBULATORY_CARE_PROVIDER_SITE_OTHER): Payer: 59 | Admitting: Obstetrics and Gynecology

## 2018-09-29 VITALS — BP 124/80 | HR 77 | Ht 62.0 in | Wt 193.0 lb

## 2018-09-29 DIAGNOSIS — Z1239 Encounter for other screening for malignant neoplasm of breast: Secondary | ICD-10-CM

## 2018-09-29 DIAGNOSIS — Z0000 Encounter for general adult medical examination without abnormal findings: Secondary | ICD-10-CM

## 2018-09-29 DIAGNOSIS — Z01419 Encounter for gynecological examination (general) (routine) without abnormal findings: Secondary | ICD-10-CM

## 2018-09-29 DIAGNOSIS — R600 Localized edema: Secondary | ICD-10-CM

## 2018-09-29 DIAGNOSIS — Z124 Encounter for screening for malignant neoplasm of cervix: Secondary | ICD-10-CM

## 2018-09-29 DIAGNOSIS — Z1151 Encounter for screening for human papillomavirus (HPV): Secondary | ICD-10-CM | POA: Diagnosis present

## 2018-09-29 DIAGNOSIS — Z13220 Encounter for screening for lipoid disorders: Secondary | ICD-10-CM

## 2018-09-29 DIAGNOSIS — Z79890 Hormone replacement therapy (postmenopausal): Secondary | ICD-10-CM

## 2018-09-29 DIAGNOSIS — F419 Anxiety disorder, unspecified: Secondary | ICD-10-CM

## 2018-09-29 DIAGNOSIS — Z131 Encounter for screening for diabetes mellitus: Secondary | ICD-10-CM

## 2018-09-29 DIAGNOSIS — N951 Menopausal and female climacteric states: Secondary | ICD-10-CM

## 2018-09-29 DIAGNOSIS — Z1211 Encounter for screening for malignant neoplasm of colon: Secondary | ICD-10-CM

## 2018-09-29 DIAGNOSIS — Z9109 Other allergy status, other than to drugs and biological substances: Secondary | ICD-10-CM

## 2018-09-29 MED ORDER — FLUTICASONE PROPIONATE 50 MCG/ACT NA SUSP: 2.0000 | Freq: Every day | NASAL | 2 refills | Status: DC

## 2018-09-29 MED ORDER — ESTRADIOL 0.025 MG/24HR TD PTTW: 1.0000 | MEDICATED_PATCH | TRANSDERMAL | 3 refills | Status: DC

## 2018-09-29 MED ORDER — LORAZEPAM 0.5 MG PO TABS: ORAL_TABLET | ORAL | 0 refills | Status: DC

## 2018-09-29 MED ORDER — HYDROCHLOROTHIAZIDE 25 MG PO TABS: ORAL_TABLET | ORAL | 1 refills | Status: DC

## 2018-09-29 NOTE — Progress Notes (Signed)
PCP: Patient, No Pcp Per   Chief Complaint  Patient presents with  . Gynecologic Exam    HPI:      Kelly Welch is a 58 y.o. [phone] who LMP was No LMP recorded. Patient has had a hysterectomy., presents today for her annual examination.  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 She does not have intermenstrual bleeding.  She does not have vasomotor sx. She uses vivelle dot 0.0375 mg with sx relief. She would like to cont ERT but willing to decrease dose. Pt has LE edema daily and decreased ERT may help.  Sex activity: single partner, contraception - status post hysterectomy. She does not have vaginal dryness.  Last Pap: July 20, 2015  Results were: no abnormalities /neg HPV DNA.  Hx of STDs: none  Last mammogram: 11/28/17  Results were: normal--routine follow-up in 12 months There is no FH of breast cancer. There is no FH of ovarian cancer. The patient does do self-breast exams.  Colonoscopy: colonoscopy 6 years ago without abnormalities. Repeat due after 5 years due to FH of polyps. Pt plans to do this yr.  Tobacco use: The patient denies current or previous tobacco use. Alcohol use: social drinker Exercise: moderately active  She does get adequate calcium and Vitamin D supp in her diet.  Borderline lipids 2016 and 12/17, 2018. Pre-DM on 12/17 labs (HgA1C=5.7%) normal in 2018, hx of gestational DM. Due for repeat lab this yr.   Pt also with anxiety issues due to being caregiver. Doesn't need daily meds, but takes ativan very sparingly. Needs Rx RF.  She needs Rx RF on flonase for allergies and HCTZ for LE edema. She has been on that for many yrs. She takes 12.5 mg daily with relief. She exercises regularly.  Doesn't have PCP.   Past Medical History:  Diagnosis Date  . ASCUS of cervix with negative high risk HPV 04/29/2014  . Fallopian tube disorder 2013   focal epithelial hyperplasia of the tubes on pathology  .  Gestational diabetes   . H/O cesarean section 93; 99; 02   x3  . Hemorrhoids 06/2013   internal per colonoscopy  . History of mammogram 9/15/15l 07/20/15   birad 2; benign  . History of Papanicolaou smear of cervix 04/29/14; 07/20/15   ascus/neg; neg/neg  . Irregular menses   . Migraine     Past Surgical History:  Procedure Laterality Date  . ABDOMINAL HYSTERECTOMY  07/01/2012   [facility]SH C BIL TUBES REMOVED  . CESAREAN SECTION     93; 99; 02  . COLONOSCOPY  06/2013   DR. ELLIOTT; INTERNAL HEMORRHOIDS; RECK IN 5HRS D/T FAM HX  . TUBAL LIGATION  2002    Family History  Problem Relation Age of Onset  . Diabetes Mother   . Hypertension Mother   . Diabetes Father   . Heart disease Father   . Hypertension Father   . Breast cancer Neg Hx     Social History   Socioeconomic History  . Marital status: Married    Spouse name: Not on file  . Number of children: 3  . Years of education: 9014  . Highest education level: Not on file  Occupational History  . Occupation: HOMEMAKER  Social Needs  . Financial resource strain: Not on file  . Food insecurity:    Worry: Not on file    Inability: Not on file  . Transportation needs:  Medical: Not on file    Non-medical: Not on file  Tobacco Use  . Smoking status: Never Smoker  . Smokeless tobacco: Never Used  Substance and Sexual Activity  . Alcohol use: Yes    Comment: OCC  . Drug use: No  . Sexual activity: Yes    Birth control/protection: Post-menopausal  Lifestyle  . Physical activity:    Days per week: 3 days    Minutes per session: 30 min  . Stress: Rather much  Relationships  . Social connections:    Talks on phone: More than three times a week    Gets together: Never    Attends religious service: More than 4 times per year    Active member of club or organization: No    Attends meetings of clubs or organizations: Never    Relationship status: Married  . Intimate partner violence:    Fear of current or ex partner:  No    Emotionally abused: No    Physically abused: No    Forced sexual activity: No  Other Topics Concern  . Not on file  Social History Narrative  . Not on file    Current Meds  Medication Sig  . Cholecalciferol (VITAMIN D3) 2000 units TABS Take 1 tablet by mouth daily.  . Doxylamine-Phenylephrine-APAP 6.25-5-325 MG CAPS Take by mouth.  . fluticasone (FLONASE) 50 MCG/ACT nasal spray Place 2 sprays into both nostrils daily.  . hydrochlorothiazide (HYDRODIURIL) 25 MG tablet Take 1/2 to 1 tab daily  . ibuprofen (ADVIL,MOTRIN) 200 MG tablet Take by mouth.  Marland Kitchen LORazepam (ATIVAN) 0.5 MG tablet Take 1-2 tabs BID prn sx  . SUPER B COMPLEX/C PO Take by mouth.  . [DISCONTINUED] estradiol (VIVELLE-DOT) 0.0375 MG/24HR APPLY ONE PATCH TWICE WEEKLY  . [DISCONTINUED] fluticasone (FLONASE) 50 MCG/ACT nasal spray Place 2 sprays into both nostrils daily.  . [DISCONTINUED] hydrochlorothiazide (HYDRODIURIL) 25 MG tablet Take 1/2 to 1 tab daily  . [DISCONTINUED] LORazepam (ATIVAN) 0.5 MG tablet Take 1-2 tabs BID prn sx      ROS:  Review of Systems  Constitutional: Negative for fatigue, fever and unexpected weight change.  Respiratory: Negative for cough, shortness of breath and wheezing.   Cardiovascular: Negative for chest pain, palpitations and leg swelling.  Gastrointestinal: Negative for blood in stool, constipation, diarrhea, nausea and vomiting.  Endocrine: Negative for cold intolerance, heat intolerance and polyuria.  Genitourinary: Negative for dyspareunia, dysuria, flank pain, frequency, genital sores, hematuria, menstrual problem, pelvic pain, urgency, vaginal bleeding, vaginal discharge and vaginal pain.  Musculoskeletal: Negative for arthralgias, back pain, joint swelling and myalgias.  Skin: Negative for rash.  Neurological: Negative for dizziness, syncope, light-headedness, numbness and headaches.  Hematological: Negative for adenopathy.  Psychiatric/Behavioral: Negative for  agitation, confusion, sleep disturbance and suicidal ideas. The patient is not nervous/anxious.      Objective: BP 124/80   Pulse 77   Ht 5\' 2"  (1.575 m)   Wt 193 lb (87.5 kg)   BMI 35.30 kg/m    Physical Exam Constitutional:      Appearance: She is well-developed.  Genitourinary:     Vulva, vagina and cervix normal.     No vaginal discharge, erythema or tenderness.     No cervical polyp.     Uterus is absent.     No right or left adnexal mass present.     Right adnexa absent.     Right adnexa not tender.     Left adnexa absent.     Left  adnexa not tender.     Genitourinary Comments: UTERUS SURG REM  Neck:     Musculoskeletal: Normal range of motion.     Thyroid: No thyromegaly.  Cardiovascular:     Rate and Rhythm: Normal rate and regular rhythm.     Heart sounds: Normal heart sounds. No murmur.  Pulmonary:     Effort: Pulmonary effort is normal.     Breath sounds: Normal breath sounds.  Chest:     Breasts:        Right: No mass, nipple discharge, skin change or tenderness.        Left: No mass, nipple discharge, skin change or tenderness.  Abdominal:     Palpations: Abdomen is soft.     Tenderness: There is no abdominal tenderness. There is no guarding.  Musculoskeletal: Normal range of motion.  Neurological:     Mental Status: She is alert and oriented to person, place, and time.     Cranial Nerves: No cranial nerve deficit.  Psychiatric:        Behavior: Behavior normal.  Vitals signs reviewed.     Assessment/Plan:  Encounter for annual routine gynecological examination  Cervical cancer screening - Plan: Cytology - PAP  Screening for HPV (human papillomavirus) - Plan: Cytology - PAP  Screening for breast cancer - Pt to sched mammo - Plan: MM 3D SCREEN BREAST BILATERAL  Blood tests for routine general physical examination - Plan: Lipid panel, Comprehensive metabolic panel, Hemoglobin A1c  Screening cholesterol level - Plan: Lipid panel  Screening  for diabetes mellitus - Plan: Hemoglobin A1c  Screening for colon cancer - Pt to call to sched colonoscopy.  Hormone replacement therapy (HRT) - Decrease to vivelle dot 0.025 mg. Rx eRxd. F/u prn.  - Plan: estradiol (VIVELLE-DOT) 0.025 MG/24HR  Vasomotor symptoms due to menopause - Plan: estradiol (VIVELLE-DOT) 0.025 MG/24HR  Lower leg edema - Rx RF HCTZ 12.5 mg. May improve with decreased ERT dose. - Plan: hydrochlorothiazide (HYDRODIURIL) 25 MG tablet  Environmental allergies - Rx RF flonase. - Plan: fluticasone (FLONASE) 50 MCG/ACT nasal spray  Anxiety - Rx RF ativan, pt uses sparingly. - Plan: LORazepam (ATIVAN) 0.5 MG tablet  Hormone replacement therapy (HRT) - Plan: estradiol (VIVELLE-DOT) 0.025 MG/24HR  Vasomotor symptoms due to menopause - Rx RF vivelle dot 0.0375 mg.  - Plan: estradiol (VIVELLE-DOT) 0.025 MG/24HR  Lower leg edema - Rx RF HCTZ. Try 1/2 tab (12.5 mg dose) to see if sx controlled. Cont exercise/minimize sodium intake. - Plan: hydrochlorothiazide (HYDRODIURIL) 25 MG tablet  Anxiety - Increased recently, due to current stressors. Rx ativan. Use sparingly. F/u prn. Cont exercise/prayer. - Plan: LORazepam (ATIVAN) 0.5 MG tablet   Meds ordered this encounter  Medications  . fluticasone (FLONASE) 50 MCG/ACT nasal spray    Sig: Place 2 sprays into both nostrils daily.    Dispense:  32 g    Refill:  2    Order Specific Question:   Supervising Provider    Answer:   Nadara MustardHARRIS, ROBERT P [id number]]  . hydrochlorothiazide (HYDRODIURIL) 25 MG tablet    Sig: Take 1/2 to 1 tab daily    Dispense:  90 tablet    Refill:  1    Order Specific Question:   Supervising Provider    Answer:   Nadara MustardHARRIS, ROBERT P [id number]]  . LORazepam (ATIVAN) 0.5 MG tablet    Sig: Take 1-2 tabs BID prn sx    Dispense:  30 tablet    Refill:  0  Order Specific Question:   Supervising Provider    Answer:   Nadara Mustard [[id number]]  . estradiol (VIVELLE-DOT) 0.025 MG/24HR    Sig: Place 1 patch onto  the skin 2 (two) times a week.    Dispense:  24 patch    Refill:  3    Order Specific Question:   Supervising Provider    Answer:   Nadara Mustard [[id number]]           GYN counsel breast self exam, mammography screening, menopause, adequate intake of calcium and vitamin D, diet and exercise    F/U  Return in about 1 year (around 09/30/2019).  Kelly Mathey B. Louvenia Golomb, PA-C 09/29/2018 2:32 PM

## 2018-10-01 LAB — CYTOLOGY - PAP
Adequacy: ABSENT
Diagnosis: NEGATIVE
HPV: NOT DETECTED

## 2018-10-21 ENCOUNTER — Other Ambulatory Visit: Payer: 59

## 2018-10-21 DIAGNOSIS — Z13220 Encounter for screening for lipoid disorders: Secondary | ICD-10-CM

## 2018-10-21 DIAGNOSIS — Z0000 Encounter for general adult medical examination without abnormal findings: Secondary | ICD-10-CM

## 2018-10-21 DIAGNOSIS — Z131 Encounter for screening for diabetes mellitus: Secondary | ICD-10-CM

## 2018-10-22 LAB — COMPREHENSIVE METABOLIC PANEL
ALT: 14 IU/L (ref 0–32)
AST: 19 IU/L (ref 0–40)
Albumin/Globulin Ratio: 1.6 (ref 1.2–2.2)
Albumin: 4.6 g/dL (ref 3.8–4.9)
Alkaline Phosphatase: 57 IU/L (ref 39–117)
BUN/Creatinine Ratio: 26 — ABNORMAL HIGH (ref 9–23)
BUN: 16 mg/dL (ref 6–24)
Bilirubin Total: 0.3 mg/dL (ref 0.0–1.2)
CO2: 25 mmol/L (ref 20–29)
Calcium: 9.8 mg/dL (ref 8.7–10.2)
Chloride: 101 mmol/L (ref 96–106)
Creatinine, Ser: 0.61 mg/dL (ref 0.57–1.00)
GFR calc Af Amer: 117 mL/min/{1.73_m2} (ref >59)
GFR calc non Af Amer: 102 mL/min/{1.73_m2} (ref >59)
Globulin, Total: 2.8 g/dL (ref 1.5–4.5)
Glucose: 102 mg/dL — ABNORMAL HIGH (ref 65–99)
Potassium: 4.5 mmol/L (ref 3.5–5.2)
Sodium: 140 mmol/L (ref 134–144)
Total Protein: 7.4 g/dL (ref 6.0–8.5)

## 2018-10-22 LAB — LIPID PANEL
CHOLESTEROL TOTAL: 215 mg/dL — AB (ref 100–199)
Chol/HDL Ratio: 3.2 ratio (ref 0.0–4.4)
HDL: 67 mg/dL (ref >39)
LDL Calculated: 125 mg/dL — ABNORMAL HIGH (ref 0–99)
TRIGLYCERIDES: 115 mg/dL (ref 0–149)
VLDL Cholesterol Cal: 23 mg/dL (ref 5–40)

## 2018-10-22 LAB — HEMOGLOBIN A1C
Est. average glucose Bld gHb Est-mCnc: 111 mg/dL
Hgb A1c MFr Bld: 5.5 % (ref 4.8–5.6)

## 2018-10-22 NOTE — Progress Notes (Signed)
Called pt, no answer, LVMTRC. 

## 2018-10-22 NOTE — Progress Notes (Signed)
Pls notify pt of normal HgA1C and CMP, lipids still borderline. Repeat in 1 yr. Thx

## 2018-10-22 NOTE — Progress Notes (Signed)
Diet changes for wt loss, plus exercise. Even 10# loss can make a difference.

## 2018-11-23 ENCOUNTER — Other Ambulatory Visit: Payer: Self-pay | Admitting: Obstetrics and Gynecology

## 2018-11-23 DIAGNOSIS — N951 Menopausal and female climacteric states: Secondary | ICD-10-CM

## 2018-11-23 DIAGNOSIS — Z79890 Hormone replacement therapy (postmenopausal): Secondary | ICD-10-CM

## 2018-11-25 ENCOUNTER — Telehealth: Payer: Self-pay

## 2018-11-25 ENCOUNTER — Other Ambulatory Visit: Payer: Self-pay | Admitting: Obstetrics and Gynecology

## 2018-11-25 DIAGNOSIS — N951 Menopausal and female climacteric states: Secondary | ICD-10-CM

## 2018-11-25 DIAGNOSIS — Z79890 Hormone replacement therapy (postmenopausal): Secondary | ICD-10-CM

## 2018-11-25 MED ORDER — ESTRADIOL 0.025 MG/24HR TD PTTW: 1.0000 | MEDICATED_PATCH | TRANSDERMAL | 3 refills | Status: DC

## 2018-11-25 NOTE — Telephone Encounter (Signed)
Pls let pt know Rx Erxd. Thx

## 2018-11-25 NOTE — Telephone Encounter (Signed)
Pt aware.

## 2018-11-25 NOTE — Progress Notes (Signed)
Rx [facility]

## 2018-11-25 NOTE — Telephone Encounter (Signed)
Pt states dose of hormone patch was changed to a lower dose.  [facility] needs a new rx reflecting this.  [phone]

## 2018-11-29 ENCOUNTER — Other Ambulatory Visit: Payer: Self-pay | Admitting: Obstetrics and Gynecology

## 2018-11-29 DIAGNOSIS — R600 Localized edema: Secondary | ICD-10-CM

## 2019-02-27 ENCOUNTER — Other Ambulatory Visit: Payer: Self-pay | Admitting: Obstetrics and Gynecology

## 2019-02-27 DIAGNOSIS — Z9109 Other allergy status, other than to drugs and biological substances: Secondary | ICD-10-CM

## 2019-03-01 ENCOUNTER — Other Ambulatory Visit: Payer: Self-pay | Admitting: Obstetrics and Gynecology

## 2019-03-01 DIAGNOSIS — R600 Localized edema: Secondary | ICD-10-CM

## 2019-03-04 ENCOUNTER — Ambulatory Visit
Admission: RE | Admit: 2019-03-04 | Discharge: 2019-03-04 | Disposition: A | Discharge status: Home / Self Care | Payer: 59 | Source: Ambulatory Visit | Attending: Obstetrics and Gynecology | Admitting: Obstetrics and Gynecology

## 2019-03-04 ENCOUNTER — Other Ambulatory Visit: Payer: Self-pay

## 2019-03-04 DIAGNOSIS — Z1231 Encounter for screening mammogram for malignant neoplasm of breast: Secondary | ICD-10-CM | POA: Diagnosis not present

## 2019-03-04 DIAGNOSIS — Z1239 Encounter for other screening for malignant neoplasm of breast: Secondary | ICD-10-CM | POA: Diagnosis present

## 2019-03-05 ENCOUNTER — Encounter: Payer: Self-pay | Admitting: Obstetrics and Gynecology

## 2019-04-28 ENCOUNTER — Other Ambulatory Visit: Payer: Self-pay | Admitting: Obstetrics and Gynecology

## 2019-04-28 DIAGNOSIS — Z9109 Other allergy status, other than to drugs and biological substances: Secondary | ICD-10-CM

## 2019-08-28 ENCOUNTER — Other Ambulatory Visit: Payer: Self-pay | Admitting: Obstetrics and Gynecology

## 2019-08-28 DIAGNOSIS — R600 Localized edema: Secondary | ICD-10-CM

## 2019-10-09 ENCOUNTER — Other Ambulatory Visit: Payer: Self-pay | Admitting: Obstetrics and Gynecology

## 2019-10-09 DIAGNOSIS — N951 Menopausal and female climacteric states: Secondary | ICD-10-CM

## 2019-10-09 DIAGNOSIS — Z79890 Hormone replacement therapy (postmenopausal): Secondary | ICD-10-CM

## 2019-10-28 DIAGNOSIS — F419 Anxiety disorder, unspecified: Secondary | ICD-10-CM | POA: Insufficient documentation

## 2019-10-28 NOTE — Progress Notes (Signed)
PCP: Patient, No Pcp Per   Chief Complaint  Patient presents with  . Gynecologic Exam    rash on side of legs since January    HPI:      [name] is a 59 y.o. [phone] who LMP was No LMP recorded. Patient has had a hysterectomy., presents today for her annual examination.  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 She does nothave intermenstrual bleeding.  She does not have vasomotor sx. She uses vivelle dot 0.025 mg with sx relief (weaned down from 0.0375 dose last yr). Wants to wean down further/off. No change with LE edema with decreased dose.   Sex activity: single partner, contraception - status post hysterectomy. She does not have vaginal dryness.  Last Pap: 09/29/18  Results were: no abnormalities /neg HPV DNA.  Hx of STDs: none  Last mammogram: 03/04/19  Results were: normal--routine follow-up in 12 months There is no FH of breast cancer. There is no FH of ovarian cancer. The patient does do self-breast exams.  Colonoscopy: colonoscopy 7 years ago without abnormalities. Repeat due after 5 years due to FH of polyps. Pt plans to do this yr and will call for appt.   Tobacco use: The patient denies current or previous tobacco use. Alcohol use: social drinker  No drug use Exercise: moderately active  She does get adequate calcium and Vitamin D supp in her diet.  Borderline lipids 2016-2020. Pre-DM on 12/17 labs (HgA1C=5.7%) normal in 2018 and 2020, hx of gestational DM. Due for repeat lab this yr.   Pt also with anxiety issues due to being caregiver. Doesn't need daily meds, but takes ativan very sparingly. Needs Rx RF.  She needs Rx RF on flonase for allergies and HCTZ for LE edema. She has been on that for many yrs. She takes 12.5 mg daily with relief. She exercises regularly.  Doesn't have PCP.   Has had a rash on different parts of body since end of Jan. Started on bilat inner arms, then front of legs, now sides of  legs/outer thigh. Very itchy at time. Took benadryl but was too sedating. Changed soaps/detergents, but still using dryer sheets.   Past Medical History:  Diagnosis Date  . ASCUS of cervix with negative high risk HPV 04/29/2014  . Fallopian tube disorder 2013   focal epithelial hyperplasia of the tubes on pathology  . Gestational diabetes   . H/O cesarean section 93; 99; 02   x3  . Hemorrhoids 06/2013   internal per colonoscopy  . History of mammogram 9/15/15l 07/20/15   birad 2; benign  . History of Papanicolaou smear of cervix 04/29/14; 07/20/15   ascus/neg; neg/neg  . Irregular menses   . Migraine     Past Surgical History:  Procedure Laterality Date  . ABDOMINAL HYSTERECTOMY  07/01/2012   LSH C BIL TUBES REMOVED  . CESAREAN SECTION     93; 99; 02  . COLONOSCOPY  06/2013   DR. ELLIOTT; INTERNAL HEMORRHOIDS; RECK IN 5HRS D/T FAM HX  . TUBAL LIGATION  2002    Family History  Problem Relation Age of Onset  . Diabetes Mother   . Hypertension Mother   . Diabetes Father   . Heart disease Father   . Hypertension Father   . Breast cancer Neg Hx     Social History   Socioeconomic History  . Marital status: Married    Spouse name: Not on file  .  Number of children: 3  . Years of education: 53  . Highest education level: Not on file  Occupational History  . Occupation: HOMEMAKER  Tobacco Use  . Smoking status: Never Smoker  . Smokeless tobacco: Never Used  Substance and Sexual Activity  . Alcohol use: Yes    Comment: OCC  . Drug use: No  . Sexual activity: Yes    Birth control/protection: Post-menopausal, Surgical    Comment: Hysterectomy  Other Topics Concern  . Not on file  Social History Narrative  . Not on file   Social Determinants of Health   Financial Resource Strain:   . Difficulty of Paying Living Expenses:   Food Insecurity:   . Worried About Charity fundraiser in the Last Year:   . Arboriculturist in the Last Year:   Transportation Needs:    . Film/video editor (Medical):   Marland Kitchen Lack of Transportation (Non-Medical):   Physical Activity:   . Days of Exercise per Week:   . Minutes of Exercise per Session:   Stress:   . Feeling of Stress :   Social Connections:   . Frequency of Communication with Friends and Family:   . Frequency of Social Gatherings with Friends and Family:   . Attends Religious Services:   . Active Member of Clubs or Organizations:   . Attends Archivist Meetings:   Marland Kitchen Marital Status:   Intimate Partner Violence:   . Fear of Current or Ex-Partner:   . Emotionally Abused:   Marland Kitchen Physically Abused:   . Sexually Abused:     Current Meds  Medication Sig  . Cholecalciferol (VITAMIN D3) 2000 units TABS Take 1 tablet by mouth daily.  Marland Kitchen estradiol (VIVELLE-DOT) 0.025 MG/24HR Place 1 patch onto the skin 2 (two) times a week.  . fluticasone (FLONASE) 50 MCG/ACT nasal spray Place 2 sprays into both nostrils daily.  . hydrochlorothiazide (HYDRODIURIL) 25 MG tablet Take 0.5 tablets (12.5 mg total) by mouth daily.  Marland Kitchen ibuprofen (ADVIL,MOTRIN) 200 MG tablet Take by mouth.  Marland Kitchen LORazepam (ATIVAN) 0.5 MG tablet Take 1-2 tabs BID prn sx  . SUPER B COMPLEX/C PO Take by mouth.  . [DISCONTINUED] fluticasone (FLONASE) 50 MCG/ACT nasal spray USE 2 SPRAYS IN EACH NOSTRIL DAILY  . [DISCONTINUED] hydrochlorothiazide (HYDRODIURIL) 25 MG tablet TAKE ONE-HALF (1/2) TO ONE TABLET DAILY  . [DISCONTINUED] LORazepam (ATIVAN) 0.5 MG tablet Take 1-2 tabs BID prn sx      ROS:  Review of Systems  Constitutional: Negative for fatigue, fever and unexpected weight change.  Respiratory: Negative for cough, shortness of breath and wheezing.   Cardiovascular: Negative for chest pain, palpitations and leg swelling.  Gastrointestinal: Negative for blood in stool, constipation, diarrhea, nausea and vomiting.  Endocrine: Negative for cold intolerance, heat intolerance and polyuria.  Genitourinary: Negative for dyspareunia, dysuria,  flank pain, frequency, genital sores, hematuria, menstrual problem, pelvic pain, urgency, vaginal bleeding, vaginal discharge and vaginal pain.  Musculoskeletal: Negative for arthralgias, back pain, joint swelling and myalgias.  Skin: Positive for rash.  Neurological: Positive for headaches. Negative for dizziness, syncope, light-headedness and numbness.  Hematological: Negative for adenopathy.  Psychiatric/Behavioral: Positive for agitation. Negative for confusion, sleep disturbance and suicidal ideas. The patient is not nervous/anxious.      Objective: BP (!) 130/100   Ht 5\' 2"  (1.575 m)   Wt 200 lb (90.7 kg)   BMI 36.58 kg/m    Physical Exam Constitutional:      Appearance: She  is well-developed.  Genitourinary:     Vulva, vagina, cervix, right adnexa and left adnexa normal.     No vaginal discharge, erythema or tenderness.     Uterus is absent.     No right or left adnexal mass present.     Right adnexa not tender.     Left adnexa not tender.     Genitourinary Comments: UTERUS SURG REM  Neck:     Thyroid: No thyromegaly.  Cardiovascular:     Rate and Rhythm: Normal rate and regular rhythm.     Heart sounds: Normal heart sounds. No murmur.  Pulmonary:     Effort: Pulmonary effort is normal.     Breath sounds: Normal breath sounds.  Chest:     Breasts:        Right: No mass, nipple discharge, skin change or tenderness.        Left: No mass, nipple discharge, skin change or tenderness.  Abdominal:     Palpations: Abdomen is soft.     Tenderness: There is no abdominal tenderness. There is no guarding.  Musculoskeletal:        General: Normal range of motion.     Cervical back: Normal range of motion.  Neurological:     General: No focal deficit present.     Mental Status: She is alert and oriented to person, place, and time.     Cranial Nerves: No cranial nerve deficit.  Skin:    General: Skin is warm and dry.     Findings: Rash present. Rash is papular. Rash is  not crusting, nodular, scaling or urticarial.     Comments: FINE PAPULAR RASH WITH ERYTHEMA BILAT OUTER THIGHS; NO SCALE  Psychiatric:        Mood and Affect: Mood normal.        Behavior: Behavior normal.        Thought Content: Thought content normal.        Judgment: Judgment normal.  Vitals reviewed.     Assessment/Plan:  Encounter for annual routine gynecological examination  Encounter for screening mammogram for malignant neoplasm of breast - Plan: MM 3D SCREEN BREAST BILATERAL; pt to sched mammo  Screening for colon cancer--pt to f/u with GI for scr colonoscopy. Will send ref prn.  Blood tests for routine general physical examination - Plan: Comprehensive metabolic panel, Lipid panel, Hemoglobin A1c  Screening cholesterol level - Plan: Lipid panel  Screening for diabetes mellitus - Plan: Hemoglobin A1c  Hormone replacement therapy (HRT)--Decrease vivelle dot 0.025 mg to 1/2 patch twice wkly (off-label use). Can then d/c. F/u prn.   Hemorrhoids, unspecified hemorrhoid type - Plan: hydrocortisone-pramoxine (ANALPRAM-HC) 2.5-1 % rectal cream; Rx RF crm. Has upcoming GI appt  Environmental allergies - Rx RF flonase. - Plan: fluticasone (FLONASE) 50 MCG/ACT nasal spray  Lower leg edema - Rx RF HCTZ 12.5 mg. - Plan: hydrochlorothiazide (HYDRODIURIL) 25 MG tablet  Anxiety - Rx RF ativan, pt uses sparingly. - Plan: LORazepam (ATIVAN) 0.5 MG tablet  Rash--question chem derm. D/c dryer sheets. Claritin/zyrtec QD to BID, cold compresses for itch. Refer to derm if sx persist.   Meds ordered this encounter  Medications  . hydrocortisone-pramoxine (ANALPRAM-HC) 2.5-1 % rectal cream    Sig: Place rectally 3 (three) times daily.    Dispense:  30 g    Refill:  0    Order Specific Question:   Supervising Provider    Answer:   Nadara Mustard [id number]  . fluticasone (FLONASE) 50  MCG/ACT nasal spray    Sig: Place 2 sprays into both nostrils daily.    Dispense:  32 g    Refill:  5     Order Specific Question:   Supervising Provider    Answer:   Nadara Mustard [id number]  . hydrochlorothiazide (HYDRODIURIL) 25 MG tablet    Sig: Take 0.5 tablets (12.5 mg total) by mouth daily.    Dispense:  90 tablet    Refill:  1    Order Specific Question:   Supervising Provider    Answer:   Nadara Mustard [id number]  . LORazepam (ATIVAN) 0.5 MG tablet    Sig: Take 1-2 tabs BID prn sx    Dispense:  30 tablet    Refill:  0    Order Specific Question:   Supervising Provider    Answer:   Nadara Mustard [[id number]]           GYN counsel breast self exam, mammography screening, menopause, adequate intake of calcium and vitamin D, diet and exercise    F/U  Return in about 1 year (around 10/28/2020).  Thanh Mottern B. Dekari Bures, PA-C 10/29/2019 11:31 AM

## 2019-10-29 ENCOUNTER — Encounter: Payer: Self-pay | Admitting: Obstetrics and Gynecology

## 2019-10-29 ENCOUNTER — Other Ambulatory Visit: Payer: Self-pay

## 2019-10-29 ENCOUNTER — Ambulatory Visit (INDEPENDENT_AMBULATORY_CARE_PROVIDER_SITE_OTHER): Payer: 59 | Admitting: Obstetrics and Gynecology

## 2019-10-29 VITALS — BP 130/100 | Ht 62.0 in | Wt 200.0 lb

## 2019-10-29 DIAGNOSIS — F419 Anxiety disorder, unspecified: Secondary | ICD-10-CM | POA: Diagnosis not present

## 2019-10-29 DIAGNOSIS — K649 Unspecified hemorrhoids: Secondary | ICD-10-CM

## 2019-10-29 DIAGNOSIS — Z0000 Encounter for general adult medical examination without abnormal findings: Secondary | ICD-10-CM

## 2019-10-29 DIAGNOSIS — Z1231 Encounter for screening mammogram for malignant neoplasm of breast: Secondary | ICD-10-CM

## 2019-10-29 DIAGNOSIS — Z131 Encounter for screening for diabetes mellitus: Secondary | ICD-10-CM

## 2019-10-29 DIAGNOSIS — Z13220 Encounter for screening for lipoid disorders: Secondary | ICD-10-CM

## 2019-10-29 DIAGNOSIS — Z01419 Encounter for gynecological examination (general) (routine) without abnormal findings: Secondary | ICD-10-CM

## 2019-10-29 DIAGNOSIS — R600 Localized edema: Secondary | ICD-10-CM | POA: Diagnosis not present

## 2019-10-29 DIAGNOSIS — Z79890 Hormone replacement therapy (postmenopausal): Secondary | ICD-10-CM

## 2019-10-29 DIAGNOSIS — Z90711 Acquired absence of uterus with remaining cervical stump: Secondary | ICD-10-CM

## 2019-10-29 DIAGNOSIS — Z9109 Other allergy status, other than to drugs and biological substances: Secondary | ICD-10-CM

## 2019-10-29 DIAGNOSIS — Z1211 Encounter for screening for malignant neoplasm of colon: Secondary | ICD-10-CM

## 2019-10-29 DIAGNOSIS — R21 Rash and other nonspecific skin eruption: Secondary | ICD-10-CM

## 2019-10-29 MED ORDER — LORAZEPAM 0.5 MG PO TABS: ORAL_TABLET | ORAL | 0 refills | Status: DC

## 2019-10-29 MED ORDER — HYDROCHLOROTHIAZIDE 25 MG PO TABS: 12.5000 mg | ORAL_TABLET | Freq: Every day | ORAL | 1 refills | Status: DC

## 2019-10-29 MED ORDER — FLUTICASONE PROPIONATE 50 MCG/ACT NA SUSP: 2.0000 | Freq: Every day | NASAL | 5 refills | Status: DC

## 2019-10-29 MED ORDER — HYDROCORT-PRAMOXINE (PERIANAL) 2.5-1 % EX CREA: TOPICAL_CREAM | Freq: Three times a day (TID) | CUTANEOUS | 0 refills | Status: AC

## 2019-10-29 NOTE — Patient Instructions (Addendum)
I value your feedback and entrusting us with your care. If you get a [facility] patient survey, I would appreciate you taking the time to let us know about your experience today. Thank you! ° °As of July 30, 2019, your lab results will be released to your MyChart immediately, before I even have a chance to see them. Please give me time to review them and contact you if there are any abnormalities. Thank you for your patience.  ° °[facility]: [phone] ° ° ° °

## 2019-10-30 LAB — COMPREHENSIVE METABOLIC PANEL
ALT: 13 IU/L (ref 0–32)
AST: 20 IU/L (ref 0–40)
Albumin/Globulin Ratio: 1.6 (ref 1.2–2.2)
Albumin: 4.7 g/dL (ref 3.8–4.9)
Alkaline Phosphatase: 63 IU/L (ref 39–117)
BUN/Creatinine Ratio: 14 (ref 9–23)
BUN: 10 mg/dL (ref 6–24)
Bilirubin Total: 0.4 mg/dL (ref 0.0–1.2)
CO2: 25 mmol/L (ref 20–29)
Calcium: 9.6 mg/dL (ref 8.7–10.2)
Chloride: 103 mmol/L (ref 96–106)
Creatinine, Ser: 0.69 mg/dL (ref 0.57–1.00)
GFR calc Af Amer: 112 mL/min/{1.73_m2} (ref >59)
GFR calc non Af Amer: 97 mL/min/{1.73_m2} (ref >59)
Globulin, Total: 2.9 g/dL (ref 1.5–4.5)
Glucose: 95 mg/dL (ref 65–99)
Potassium: 3.9 mmol/L (ref 3.5–5.2)
Sodium: 141 mmol/L (ref 134–144)
Total Protein: 7.6 g/dL (ref 6.0–8.5)

## 2019-10-30 LAB — LIPID PANEL
Chol/HDL Ratio: 3.4 ratio (ref 0.0–4.4)
Cholesterol, Total: 216 mg/dL — ABNORMAL HIGH (ref 100–199)
HDL: 64 mg/dL (ref >39)
LDL Chol Calc (NIH): 127 mg/dL — ABNORMAL HIGH (ref 0–99)
Triglycerides: 143 mg/dL (ref 0–149)
VLDL Cholesterol Cal: 25 mg/dL (ref 5–40)

## 2019-10-30 LAB — HEMOGLOBIN A1C
Est. average glucose Bld gHb Est-mCnc: 117 mg/dL
Hgb A1c MFr Bld: 5.7 % — ABNORMAL HIGH (ref 4.8–5.6)

## 2019-11-06 ENCOUNTER — Telehealth: Payer: Self-pay

## 2019-11-06 NOTE — Telephone Encounter (Signed)
[facility] calling regarding RX for cream. She is wanting to know if [facility] wanted the 1% or 2%. Call back number [phone] and reference # [id number]. Please call back. I was not sure and did not want to tell her wrong.

## 2019-11-09 NOTE — Telephone Encounter (Signed)
Called pharmacy, Rx you sent is not covered by insurance, they have plain hydrocortisone 2.5%, is it ok?

## 2019-11-09 NOTE — Telephone Encounter (Signed)
Yes, we also faxed that back on Fri. Thx.

## 2019-11-09 NOTE — Telephone Encounter (Signed)
Spoke to pharmacist Kathlene November and all is taken care of.

## 2019-12-28 ENCOUNTER — Other Ambulatory Visit: Payer: Self-pay | Admitting: Obstetrics and Gynecology

## 2019-12-28 ENCOUNTER — Encounter: Payer: Self-pay | Admitting: Obstetrics and Gynecology

## 2019-12-28 DIAGNOSIS — N951 Menopausal and female climacteric states: Secondary | ICD-10-CM

## 2019-12-28 DIAGNOSIS — Z79890 Hormone replacement therapy (postmenopausal): Secondary | ICD-10-CM

## 2019-12-28 MED ORDER — ESTRADIOL 0.025 MG/24HR TD PTTW: 1.0000 | MEDICATED_PATCH | TRANSDERMAL | 2 refills | Status: DC

## 2019-12-28 NOTE — Progress Notes (Signed)
Rx RF vivelle dot for ERT/vasomotor sx.

## 2020-03-20 ENCOUNTER — Encounter: Payer: Self-pay | Admitting: Obstetrics and Gynecology

## 2020-03-21 NOTE — Telephone Encounter (Signed)
Spoke with pt. She is feeling much better today. BMI qualifies her for OP antibody tx but she is feeling better today. Pt to f/u if sx start to worsen for ref to Infusion clinic at [facility].

## 2020-03-22 ENCOUNTER — Other Ambulatory Visit (HOSPITAL_COMMUNITY): Payer: Self-pay | Admitting: Nurse Practitioner

## 2020-03-22 ENCOUNTER — Encounter: Payer: Self-pay | Admitting: Obstetrics and Gynecology

## 2020-03-22 DIAGNOSIS — U071 COVID-19: Secondary | ICD-10-CM

## 2020-03-22 NOTE — Progress Notes (Signed)
I connected by phone with Kelly Welch on 03/22/2020 at 3:58 PM to discuss the potential use of an new treatment for mild to moderate COVID-19 viral infection in non-hospitalized patients.  This patient is a 59 y.o. female that meets the FDA criteria for Emergency Use Authorization of casirivimab\imdevimab.  Has a (+) direct SARS-CoV-2 viral test result  Has mild or moderate COVID-19   Is ? 58 years of age and weighs ? 40 kg  Is NOT hospitalized due to COVID-19  Is NOT requiring oxygen therapy or requiring an increase in baseline oxygen flow rate due to COVID-19  Is within 10 days of symptom onset  Has at least one of the high risk factor(s) for progression to severe COVID-19 and/or hospitalization as defined in EUA.  Specific high risk criteria : BMI > 25   I have spoken and communicated the following to the patient or parent/caregiver:  1. FDA has authorized the emergency use of casirivimab\imdevimab for the treatment of mild to moderate COVID-19 in adults and pediatric patients with positive results of direct SARS-CoV-2 viral testing who are 37 years of age and older weighing at least 40 kg, and who are at high risk for progressing to severe COVID-19 and/or hospitalization.  2. The significant known and potential risks and benefits of casirivimab\imdevimab, and the extent to which such potential risks and benefits are unknown.  3. Information on available alternative treatments and the risks and benefits of those alternatives, including clinical trials.  4. Patients treated with casirivimab\imdevimab should continue to self-isolate and use infection control measures (e.g., wear mask, isolate, social distance, avoid sharing personal items, clean and disinfect high touch surfaces, and frequent handwashing) according to CDC guidelines.   5. The patient or parent/caregiver has the option to accept or refuse casirivimab\imdevimab .  After reviewing this information with the  patient, The patient agreed to proceed with receiving casirivimab\imdevimab infusion and will be provided a copy of the Fact sheet prior to receiving the infusion.Consuello Masse, DNP, AGNP-C [phone] (Infusion Center Hotline)

## 2020-03-22 NOTE — Telephone Encounter (Signed)
MAB ref sent and called in since got an out of town msg on secure chat

## 2020-03-23 MED ORDER — SODIUM CHLORIDE 0.9 % IV SOLN
Freq: Once | INTRAVENOUS | Status: AC
  Filled 2020-03-23: qty 600

## 2020-03-24 ENCOUNTER — Ambulatory Visit (HOSPITAL_COMMUNITY)
Admission: RE | Admit: 2020-03-24 | Discharge: 2020-03-24 | Disposition: A | Discharge status: Home / Self Care | Payer: 59 | Source: Ambulatory Visit | Attending: Pulmonary Disease | Admitting: Pulmonary Disease

## 2020-03-24 DIAGNOSIS — U071 COVID-19: Secondary | ICD-10-CM | POA: Insufficient documentation

## 2020-03-24 MED ORDER — EPINEPHRINE 0.3 MG/0.3ML IJ SOAJ: 0.3000 mg | Freq: Once | INTRAMUSCULAR | Status: DC | PRN

## 2020-03-24 MED ORDER — DIPHENHYDRAMINE HCL 50 MG/ML IJ SOLN: 50.0000 mg | Freq: Once | INTRAMUSCULAR | Status: DC | PRN

## 2020-03-24 MED ORDER — FAMOTIDINE IN NACL 20-0.9 MG/50ML-% IV SOLN: 20.0000 mg | Freq: Once | INTRAVENOUS | Status: DC | PRN

## 2020-03-24 MED ORDER — ALBUTEROL SULFATE HFA 108 (90 BASE) MCG/ACT IN AERS: 2.0000 | INHALATION_SPRAY | Freq: Once | RESPIRATORY_TRACT | Status: DC | PRN

## 2020-03-24 MED ORDER — METHYLPREDNISOLONE SODIUM SUCC 125 MG IJ SOLR: 125.0000 mg | Freq: Once | INTRAMUSCULAR | Status: DC | PRN

## 2020-03-24 MED ORDER — SODIUM CHLORIDE 0.9 % IV SOLN: INTRAVENOUS | Status: DC | PRN

## 2020-03-24 NOTE — Discharge Instructions (Signed)

## 2020-03-24 NOTE — Progress Notes (Signed)
  Diagnosis: COVID-19  Physician: Dr. Wright  Procedure: [facility] Med: casirivimab\imdevimab infusion - Provided patient with casirivimab\imdevimab fact sheet for patients, parents and caregivers prior to infusion.  Complications: No immediate complications noted.  Discharge: Discharged home   Kelly Welch 03/24/2020  

## 2020-04-20 ENCOUNTER — Ambulatory Visit
Admission: RE | Admit: 2020-04-20 | Discharge: 2020-04-20 | Disposition: A | Discharge status: Home / Self Care | Payer: 59 | Source: Ambulatory Visit | Attending: Obstetrics and Gynecology | Admitting: Obstetrics and Gynecology

## 2020-04-20 ENCOUNTER — Other Ambulatory Visit: Payer: Self-pay

## 2020-04-20 DIAGNOSIS — Z1231 Encounter for screening mammogram for malignant neoplasm of breast: Secondary | ICD-10-CM | POA: Diagnosis not present

## 2020-04-21 ENCOUNTER — Encounter: Payer: Self-pay | Admitting: Obstetrics and Gynecology

## 2020-06-28 ENCOUNTER — Ambulatory Visit: Payer: 59 | Admitting: Obstetrics and Gynecology

## 2020-06-30 NOTE — Progress Notes (Signed)
Patient, No Pcp Per   Chief Complaint  Patient presents with  . Breast Exam    tenderness in both breast x 2 weeks    HPI:      [name] is a 59 y.o. [phone] whose LMP was No LMP recorded. Patient has had a hysterectomy., presents today for bilat breast tenderness with palpation for the past 2 wks. Was drinking caffeine but has cut back this past wk. No breast masses. Is on estradiol 0.025 mg patch for vasomotor with sx control. No change in hormone doses before sx started. No FH breast/ovar cancer. Had neg mammo 9/21.  S/p covid 8/21 with infusion tx. Is losing her hair now. Is taking MVI and Vit D supp.   Past Medical History:  Diagnosis Date  . ASCUS of cervix with negative high risk HPV 04/29/2014  . Fallopian tube disorder 2013   focal epithelial hyperplasia of the tubes on pathology  . Gestational diabetes   . H/O cesarean section 93; 99; 02   x3  . Hemorrhoids 06/2013   internal per colonoscopy  . History of mammogram 9/15/15l 07/20/15   birad 2; benign  . History of Papanicolaou smear of cervix 04/29/14; 07/20/15   ascus/neg; neg/neg  . Irregular menses   . Migraine     Past Surgical History:  Procedure Laterality Date  . ABDOMINAL HYSTERECTOMY  07/01/2012   LSH C BIL TUBES REMOVED  . CESAREAN SECTION     93; 99; 02  . COLONOSCOPY  06/2013   DR. ELLIOTT; INTERNAL HEMORRHOIDS; RECK IN 5HRS D/T FAM HX  . TUBAL LIGATION  2002    Family History  Problem Relation Age of Onset  . Diabetes Mother   . Hypertension Mother   . Diabetes Father   . Heart disease Father   . Hypertension Father   . Breast cancer Neg Hx     Social History   Socioeconomic History  . Marital status: Married    Spouse name: Not on file  . Number of children: 3  . Years of education: 31  . Highest education level: Not on file  Occupational History  . Occupation: HOMEMAKER  Tobacco Use  . Smoking status: Never Smoker  . Smokeless tobacco: Never Used  Vaping Use    . Vaping Use: Never used  Substance and Sexual Activity  . Alcohol use: Yes    Comment: OCC  . Drug use: No  . Sexual activity: Yes    Birth control/protection: Post-menopausal, Surgical    Comment: Hysterectomy  Other Topics Concern  . Not on file  Social History Narrative  . Not on file   Social Determinants of Health   Financial Resource Strain:   . Difficulty of Paying Living Expenses: Not on file  Food Insecurity:   . Worried About Programme researcher, broadcasting/film/video in the Last Year: Not on file  . Ran Out of Food in the Last Year: Not on file  Transportation Needs:   . Lack of Transportation (Medical): Not on file  . Lack of Transportation (Non-Medical): Not on file  Physical Activity:   . Days of Exercise per Week: Not on file  . Minutes of Exercise per Session: Not on file  Stress:   . Feeling of Stress : Not on file  Social Connections:   . Frequency of Communication with Friends and Family: Not on file  . Frequency of Social Gatherings with Friends and Family: Not on file  . Attends Religious  Services: Not on file  . Active Member of Clubs or Organizations: Not on file  . Attends Banker Meetings: Not on file  . Marital Status: Not on file  Intimate Partner Violence:   . Fear of Current or Ex-Partner: Not on file  . Emotionally Abused: Not on file  . Physically Abused: Not on file  . Sexually Abused: Not on file    Outpatient Medications Prior to Visit  Medication Sig Dispense Refill  . Cholecalciferol (VITAMIN D3) 2000 units TABS Take 1 tablet by mouth daily.    Marland Kitchen estradiol (VIVELLE-DOT) 0.025 MG/24HR Place 1 patch onto the skin 2 (two) times a week. 24 patch 2  . fluticasone (FLONASE) 50 MCG/ACT nasal spray Place 2 sprays into both nostrils daily. 32 g 5  . hydrochlorothiazide (HYDRODIURIL) 25 MG tablet Take 0.5 tablets (12.5 mg total) by mouth daily. 90 tablet 1  . hydrocortisone-pramoxine (ANALPRAM-HC) 2.5-1 % rectal cream Place rectally 3 (three) times  daily. 30 g 0  . ibuprofen (ADVIL,MOTRIN) 200 MG tablet Take by mouth.    Marland Kitchen LORazepam (ATIVAN) 0.5 MG tablet Take 1-2 tabs BID prn sx 30 tablet 0  . SUPER B COMPLEX/C PO Take by mouth.    . Doxylamine-Phenylephrine-APAP 6.25-5-325 MG CAPS Take by mouth. (Patient not taking: Reported on 07/04/2020)     No facility-administered medications prior to visit.      ROS:  Review of Systems  Constitutional: Negative for fever.  Gastrointestinal: Negative for blood in stool, constipation, diarrhea, nausea and vomiting.  Genitourinary: Negative for dyspareunia, dysuria, flank pain, frequency, hematuria, urgency, vaginal bleeding, vaginal discharge and vaginal pain.  Musculoskeletal: Negative for back pain.  Skin: Negative for rash.   BREAST: tenderness   OBJECTIVE:   Vitals:  BP 140/90   Ht 5\' 2"  (1.575 m)   Wt 193 lb (87.5 kg)   BMI 35.30 kg/m   Physical Exam Vitals reviewed.  Pulmonary:     Effort: Pulmonary effort is normal.  Chest:     Breasts: Breasts are symmetrical.        Right: Tenderness present. No inverted nipple, mass, nipple discharge or skin change.        Left: Tenderness present. No inverted nipple, mass, nipple discharge or skin change.    Musculoskeletal:        General: Normal range of motion.     Cervical back: Normal range of motion.  Skin:    General: Skin is warm and dry.  Neurological:     General: No focal deficit present.     Mental Status: She is alert and oriented to person, place, and time.     Cranial Nerves: No cranial nerve deficit.  Psychiatric:        Mood and Affect: Mood normal.        Behavior: Behavior normal.        Thought Content: Thought content normal.        Judgment: Judgment normal.    Assessment/Plan: Breast tenderness--bilat, neg exam. Neg mammo 9/21. D/C caffeine, add Vit E 400 IU BID prn sx. If sx persist, will refer to gen surg for further eval. Reassurance.   Hair loss--after covid. Reassurance. Cont MVI, follow  expected course.     Return if symptoms worsen or fail to improve.  Jazilyn Siegenthaler B. Johany Hansman, PA-C 07/04/2020 10:08 AM

## 2020-07-04 ENCOUNTER — Other Ambulatory Visit: Payer: Self-pay

## 2020-07-04 ENCOUNTER — Encounter: Payer: Self-pay | Admitting: Obstetrics and Gynecology

## 2020-07-04 ENCOUNTER — Ambulatory Visit (INDEPENDENT_AMBULATORY_CARE_PROVIDER_SITE_OTHER): Payer: 59 | Admitting: Obstetrics and Gynecology

## 2020-07-04 VITALS — BP 140/90 | Ht 62.0 in | Wt 193.0 lb

## 2020-07-04 DIAGNOSIS — N644 Mastodynia: Secondary | ICD-10-CM

## 2020-07-04 NOTE — Patient Instructions (Signed)
I value your feedback and entrusting us with your care. If you get a [facility] patient survey, I would appreciate you taking the time to let us know about your experience today. Thank you!  As of July 30, 2019, your lab results will be released to your MyChart immediately, before I even have a chance to see them. Please give me time to review them and contact you if there are any abnormalities. Thank you for your patience.  

## 2020-08-18 ENCOUNTER — Other Ambulatory Visit: Payer: Self-pay | Admitting: Obstetrics and Gynecology

## 2020-08-18 DIAGNOSIS — N951 Menopausal and female climacteric states: Secondary | ICD-10-CM

## 2020-08-18 DIAGNOSIS — Z79890 Hormone replacement therapy (postmenopausal): Secondary | ICD-10-CM

## 2020-10-05 ENCOUNTER — Other Ambulatory Visit: Payer: Self-pay | Admitting: Obstetrics and Gynecology

## 2020-10-05 DIAGNOSIS — R600 Localized edema: Secondary | ICD-10-CM

## 2020-10-05 NOTE — Telephone Encounter (Signed)
Annual 3/16

## 2020-10-31 ENCOUNTER — Telehealth: Payer: Self-pay

## 2020-10-31 NOTE — Telephone Encounter (Signed)
Disregard

## 2020-10-31 NOTE — Telephone Encounter (Signed)
Pt aware.

## 2020-10-31 NOTE — Telephone Encounter (Signed)
-----   Message from Rica Records, PA-C sent at 10/31/2020  2:23 PM EDT ----- Regarding: labs tomorrow PT called asking about labs tomorrow at appt and if she needs to be fasting. Yes to labs and fasting. Pls notify her. Thx.

## 2020-11-01 DIAGNOSIS — R7303 Prediabetes: Secondary | ICD-10-CM | POA: Insufficient documentation

## 2020-11-01 DIAGNOSIS — E785 Hyperlipidemia, unspecified: Secondary | ICD-10-CM | POA: Insufficient documentation

## 2020-11-01 NOTE — Progress Notes (Signed)
PCP: Patient, No Pcp Per   Chief Complaint  Patient presents with  . Gynecologic Exam    No concerns    HPI:      Kelly Welch is a 60 y.o. [phone] who LMP was No LMP recorded. Patient has had a hysterectomy., presents today for her annual examination.  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 She does not have PMB.  She does not have vasomotor sx. She uses vivelle dot 0.025 mg with sx relief, tried to wean off but vasomotor sx were bad. Doing well with 0.025 dose.  Sex activity: single partner, contraception - status post hysterectomy. She does not have vaginal dryness.  Last Pap: 09/29/18  Results were: no abnormalities /neg HPV DNA. Still has cx. Hx of STDs: none  Last mammogram: 04/20/20  Results were: normal--routine follow-up in 12 months There is no FH of breast cancer. There is no FH of ovarian cancer. The patient does do self-breast exams. Breast tenderness from 11/21 resolved.   Colonoscopy: colonoscopy 8 years ago without abnormalities. Repeat due after 5 years due to FH of polyps. Pt had scheduled earlier this yr but canceled due to covid. Plans to reschedule.    Tobacco use: The patient denies current or previous tobacco use. Alcohol use: social drinker  No drug use Exercise: moderately active  She does get adequate calcium and Vitamin D supp in her diet.  Borderline lipids 2016-2021. Pre-DM on 12/17 labs (HgA1C=5.7%) and 2021;  normal in 2018 and 2020, hx of gestational DM. Due for repeat lab this yr.   Pt also with anxiety issues due to being caregiver. Doesn't need daily meds, but takes ativan very sparingly. Needs Rx RF.  She needs Rx RF on flonase for allergies and HCTZ for LE edema. She has been on that for many yrs. She takes 12.5 mg daily with relief. She exercises regularly.  Doesn't have PCP.   Past Medical History:  Diagnosis Date  . ASCUS of cervix with negative high risk HPV 04/29/2014  . Fallopian  tube disorder 2013   focal epithelial hyperplasia of the tubes on pathology  . Gestational diabetes   . H/O cesarean section 93; 99; 02   x3  . Hemorrhoids 06/2013   internal per colonoscopy  . History of mammogram 9/15/15l 07/20/15   birad 2; benign  . History of Papanicolaou smear of cervix 04/29/14; 07/20/15   ascus/neg; neg/neg  . Irregular menses   . Migraine     Past Surgical History:  Procedure Laterality Date  . ABDOMINAL HYSTERECTOMY  07/01/2012   LSH C BIL TUBES REMOVED  . CESAREAN SECTION     93; 99; 02  . COLONOSCOPY  06/2013   DR. ELLIOTT; INTERNAL HEMORRHOIDS; RECK IN 5HRS D/T FAM HX  . TUBAL LIGATION  2002    Family History  Problem Relation Age of Onset  . Diabetes Mother   . Hypertension Mother   . Diabetes Father   . Heart disease Father   . Hypertension Father   . Breast cancer Neg Hx     Social History   Socioeconomic History  . Marital status: Married    Spouse name: Not on file  . Number of children: 3  . Years of education: 69  . Highest education level: Not on file  Occupational History  . Occupation: HOMEMAKER  Tobacco Use  . Smoking status: Never Smoker  . Smokeless tobacco: Never Used  Vaping Use  . Vaping Use: Never used  Substance and Sexual Activity  . Alcohol use: Yes    Comment: OCC  . Drug use: No  . Sexual activity: Yes    Birth control/protection: Post-menopausal, Surgical    Comment: Hysterectomy  Other Topics Concern  . Not on file  Social History Narrative  . Not on file   Social Determinants of Health   Financial Resource Strain: Not on file  Food Insecurity: Not on file  Transportation Needs: Not on file  Physical Activity: Not on file  Stress: Not on file  Social Connections: Not on file  Intimate Partner Violence: Not on file    Current Meds  Medication Sig  . Cholecalciferol (VITAMIN D3) 2000 units TABS Take 1 tablet by mouth daily.  . hydrocortisone-pramoxine (ANALPRAM-HC) 2.5-1 % rectal cream  Place rectally 3 (three) times daily.  Marland Kitchen ibuprofen (ADVIL,MOTRIN) 200 MG tablet Take by mouth.  . SUPER B COMPLEX/C PO Take by mouth.  . [DISCONTINUED] estradiol (VIVELLE-DOT) 0.025 MG/24HR PLACE 1 PATCH ON THE SKIN TWICE A WEEK  . [DISCONTINUED] fluticasone (FLONASE) 50 MCG/ACT nasal spray Place 2 sprays into both nostrils daily.  . [DISCONTINUED] hydrochlorothiazide (HYDRODIURIL) 25 MG tablet TAKE ONE-HALF (1/2) TABLET DAILY  . [DISCONTINUED] LORazepam (ATIVAN) 0.5 MG tablet Take 1-2 tabs BID prn sx      ROS:  Review of Systems  Constitutional: Negative for fatigue, fever and unexpected weight change.  Respiratory: Negative for cough, shortness of breath and wheezing.   Cardiovascular: Negative for chest pain, palpitations and leg swelling.  Gastrointestinal: Negative for blood in stool, constipation, diarrhea, nausea and vomiting.  Endocrine: Negative for cold intolerance, heat intolerance and polyuria.  Genitourinary: Negative for dyspareunia, dysuria, flank pain, frequency, genital sores, hematuria, menstrual problem, pelvic pain, urgency, vaginal bleeding, vaginal discharge and vaginal pain.  Musculoskeletal: Negative for arthralgias, back pain, joint swelling and myalgias.  Skin: Positive for rash.  Neurological: Negative for dizziness, syncope, light-headedness, numbness and headaches.  Hematological: Negative for adenopathy.  Psychiatric/Behavioral: Negative for agitation, confusion, sleep disturbance and suicidal ideas. The patient is not nervous/anxious.      Objective: BP 130/80   Ht 5\' 2"  (1.575 m)   Wt 187 lb (84.8 kg)   BMI 34.20 kg/m    Physical Exam Constitutional:      Appearance: She is well-developed.  Genitourinary:     Vulva normal.     Right Labia: No rash, tenderness or lesions.    Left Labia: No tenderness, lesions or rash.    No vaginal discharge, erythema or tenderness.      Right Adnexa: not tender and no mass present.    Left Adnexa: not  tender and no mass present.    No cervical friability or polyp.     Uterus is absent.  Breasts:     Right: No mass, nipple discharge, skin change or tenderness.     Left: No mass, nipple discharge, skin change or tenderness.    Neck:     Thyroid: No thyromegaly.  Cardiovascular:     Rate and Rhythm: Normal rate and regular rhythm.     Heart sounds: Normal heart sounds. No murmur heard.   Pulmonary:     Effort: Pulmonary effort is normal.     Breath sounds: Normal breath sounds.  Abdominal:     Palpations: Abdomen is soft.     Tenderness: There is no abdominal tenderness. There is no guarding or rebound.  Musculoskeletal:  General: Normal range of motion.     Cervical back: Normal range of motion.  Lymphadenopathy:     Cervical: No cervical adenopathy.  Neurological:     General: No focal deficit present.     Mental Status: She is alert and oriented to person, place, and time.     Cranial Nerves: No cranial nerve deficit.  Skin:    General: Skin is warm and dry.  Psychiatric:        Mood and Affect: Mood normal.        Behavior: Behavior normal.        Thought Content: Thought content normal.        Judgment: Judgment normal.  Vitals reviewed.     Assessment/Plan:  Encounter for annual routine gynecological examination  Encounter for screening mammogram for malignant neoplasm of breast - Plan: MM 3D SCREEN BREAST BILATERAL; pt to sched mammo  Screening for colon cancer--pt to f/u with GI to reschedule scr colonoscopy. Will send ref prn.  Hormone replacement therapy (HRT)--Rx RF vivelle dot. Doing well. F/u prn.   Vasomotor symptoms due to menopause  Blood tests for routine general physical examination - Plan: Comprehensive metabolic panel, Lipid panel, Hemoglobin A1c,   Elevated lipids - Plan: Lipid panel,   Pre-diabetes - Plan: Hemoglobin A1c,   Environmental allergies - Rx RF flonase. - Plan: fluticasone (FLONASE) 50 MCG/ACT nasal spray  Lower leg  edema - Rx RF HCTZ 12.5 mg. - Plan: hydrochlorothiazide (HYDRODIURIL) 25 MG tablet  Anxiety - Rx RF ativan, pt uses sparingly. - Plan: LORazepam (ATIVAN) 0.5 MG tablet    Meds ordered this encounter  Medications  . LORazepam (ATIVAN) 0.5 MG tablet    Sig: Take 1-2 tabs BID prn sx    Dispense:  30 tablet    Refill:  0    Order Specific Question:   Supervising Provider    Answer:   Nadara Mustard [id number]  . fluticasone (FLONASE) 50 MCG/ACT nasal spray    Sig: Place 2 sprays into both nostrils daily.    Dispense:  32 g    Refill:  5    Order Specific Question:   Supervising Provider    Answer:   Nadara Mustard [id number]  . hydrochlorothiazide (HYDRODIURIL) 25 MG tablet    Sig: Take 0.5 tablets (12.5 mg total) by mouth daily.    Dispense:  90 tablet    Refill:  1    Order Specific Question:   Supervising Provider    Answer:   Nadara Mustard [id number]  . estradiol (VIVELLE-DOT) 0.025 MG/24HR    Sig: PLACE 1 PATCH ON THE SKIN TWICE A WEEK    Dispense:  24 patch    Refill:  3    Order Specific Question:   Supervising Provider    Answer:   Nadara Mustard [[id number]]           GYN counsel breast self exam, mammography screening, menopause, adequate intake of calcium and vitamin D, diet and exercise    F/U  Return in about 1 year (around 11/02/2021).  Gursimran Litaker B. Masson Nalepa, PA-C 11/02/2020 10:47 AM

## 2020-11-02 ENCOUNTER — Encounter: Payer: Self-pay | Admitting: Obstetrics and Gynecology

## 2020-11-02 ENCOUNTER — Other Ambulatory Visit: Payer: Self-pay

## 2020-11-02 ENCOUNTER — Ambulatory Visit (INDEPENDENT_AMBULATORY_CARE_PROVIDER_SITE_OTHER): Payer: 59 | Admitting: Obstetrics and Gynecology

## 2020-11-02 VITALS — BP 130/80 | Ht 62.0 in | Wt 187.0 lb

## 2020-11-02 DIAGNOSIS — F419 Anxiety disorder, unspecified: Secondary | ICD-10-CM

## 2020-11-02 DIAGNOSIS — N951 Menopausal and female climacteric states: Secondary | ICD-10-CM | POA: Diagnosis not present

## 2020-11-02 DIAGNOSIS — Z01419 Encounter for gynecological examination (general) (routine) without abnormal findings: Secondary | ICD-10-CM | POA: Diagnosis not present

## 2020-11-02 DIAGNOSIS — Z0000 Encounter for general adult medical examination without abnormal findings: Secondary | ICD-10-CM

## 2020-11-02 DIAGNOSIS — Z79890 Hormone replacement therapy (postmenopausal): Secondary | ICD-10-CM | POA: Diagnosis not present

## 2020-11-02 DIAGNOSIS — R7303 Prediabetes: Secondary | ICD-10-CM

## 2020-11-02 DIAGNOSIS — Z1211 Encounter for screening for malignant neoplasm of colon: Secondary | ICD-10-CM

## 2020-11-02 DIAGNOSIS — R600 Localized edema: Secondary | ICD-10-CM

## 2020-11-02 DIAGNOSIS — Z1231 Encounter for screening mammogram for malignant neoplasm of breast: Secondary | ICD-10-CM | POA: Diagnosis not present

## 2020-11-02 DIAGNOSIS — Z9109 Other allergy status, other than to drugs and biological substances: Secondary | ICD-10-CM

## 2020-11-02 DIAGNOSIS — E785 Hyperlipidemia, unspecified: Secondary | ICD-10-CM

## 2020-11-02 MED ORDER — ESTRADIOL 0.025 MG/24HR TD PTTW: MEDICATED_PATCH | TRANSDERMAL | 3 refills | Status: DC

## 2020-11-02 MED ORDER — HYDROCHLOROTHIAZIDE 25 MG PO TABS: 12.5000 mg | ORAL_TABLET | Freq: Every day | ORAL | 1 refills | Status: DC

## 2020-11-02 MED ORDER — LORAZEPAM 0.5 MG PO TABS: ORAL_TABLET | ORAL | 0 refills | Status: DC

## 2020-11-02 MED ORDER — FLUTICASONE PROPIONATE 50 MCG/ACT NA SUSP: 2.0000 | Freq: Every day | NASAL | 5 refills | Status: DC

## 2020-11-02 NOTE — Patient Instructions (Addendum)
I value your feedback and you entrusting us with your care. If you get a [facility] patient survey, I would appreciate you taking the time to let us know about your experience today. Thank you!  [facility]: [phone]      

## 2020-11-03 LAB — COMPREHENSIVE METABOLIC PANEL
ALT: 14 IU/L (ref 0–32)
AST: 23 IU/L (ref 0–40)
Albumin/Globulin Ratio: 1.6 (ref 1.2–2.2)
Albumin: 4.7 g/dL (ref 3.8–4.9)
Alkaline Phosphatase: 68 IU/L (ref 44–121)
BUN/Creatinine Ratio: 25 — ABNORMAL HIGH (ref 9–23)
BUN: 16 mg/dL (ref 6–24)
Bilirubin Total: 0.5 mg/dL (ref 0.0–1.2)
CO2: 22 mmol/L (ref 20–29)
Calcium: 9.5 mg/dL (ref 8.7–10.2)
Chloride: 100 mmol/L (ref 96–106)
Creatinine, Ser: 0.65 mg/dL (ref 0.57–1.00)
Globulin, Total: 2.9 g/dL (ref 1.5–4.5)
Glucose: 95 mg/dL (ref 65–99)
Potassium: 3.7 mmol/L (ref 3.5–5.2)
Sodium: 140 mmol/L (ref 134–144)
Total Protein: 7.6 g/dL (ref 6.0–8.5)
eGFR: 102 mL/min/{1.73_m2} (ref >59)

## 2020-11-03 LAB — LIPID PANEL
Chol/HDL Ratio: 3.8 ratio (ref 0.0–4.4)
Cholesterol, Total: 230 mg/dL — ABNORMAL HIGH (ref 100–199)
HDL: 61 mg/dL (ref >39)
LDL Chol Calc (NIH): 145 mg/dL — ABNORMAL HIGH (ref 0–99)
Triglycerides: 137 mg/dL (ref 0–149)
VLDL Cholesterol Cal: 24 mg/dL (ref 5–40)

## 2020-11-03 LAB — HEMOGLOBIN A1C
Est. average glucose Bld gHb Est-mCnc: 114 mg/dL
Hgb A1c MFr Bld: 5.6 % (ref 4.8–5.6)

## 2021-05-25 ENCOUNTER — Other Ambulatory Visit: Payer: Self-pay

## 2021-05-25 ENCOUNTER — Ambulatory Visit
Admission: RE | Admit: 2021-05-25 | Discharge: 2021-05-25 | Disposition: A | Discharge status: Home / Self Care | Payer: 59 | Source: Ambulatory Visit | Attending: Obstetrics and Gynecology | Admitting: Obstetrics and Gynecology

## 2021-05-25 DIAGNOSIS — Z1231 Encounter for screening mammogram for malignant neoplasm of breast: Secondary | ICD-10-CM | POA: Diagnosis present

## 2021-05-30 ENCOUNTER — Encounter: Payer: Self-pay | Admitting: Obstetrics and Gynecology

## 2021-11-02 NOTE — Progress Notes (Signed)
? ?PCP: Patient, No Pcp Per (Inactive) ? ? ?Chief Complaint  ?Patient presents with  ? Gynecologic Exam  ?  No concerns  ? ? ?HPI: ?     [name] is a 61 y.o. [phone] who LMP was No LMP recorded. Patient has had a hysterectomy., presents today for her annual examination.  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 She does not have PMB. ? ?She does not have vasomotor sx. She uses vivelle dot 0.025 mg with sx relief, tried to wean off but vasomotor sx were bad. Doing well with 0.025 dose. Wants to continue.  ? ?Sex activity: single partner, contraception - status post hysterectomy. She does not have vaginal dryness. ? ?Last Pap: 09/29/18  Results were: no abnormalities /neg HPV DNA. Still has cx. ?Hx of STDs: none ? ?Last mammogram: 05/25/21  Results were: normal--routine follow-up in 12 months ?There is no FH of breast cancer. There is no FH of ovarian cancer. The patient does self-breast exams.  ? ?Colonoscopy: colonoscopy 11/14 with Dr. Vira Agar without abnormalities. Repeat due after 5 years due to [place] of polyps. Pt had scheduled in past but canceled due to covid. Needs to reschedule.   ? ?Tobacco use: The patient denies current or previous tobacco use. ?Alcohol use: social drinker  ?No drug use ?Exercise: moderately active ? ?She does get adequate calcium and Vitamin D supp in her diet. ? ?Borderline lipids 2016-2022. Pre-DM on 12/17 labs (HgA1C=5.7%) and 2021, 2022;  normal in 2018 and 2020, hx of gestational DM. Due for repeat lab this yr.  ? ?Pt also with anxiety issues due to being caregiver. Doesn't need daily meds, but takes ativan very sparingly. Needs Rx RF. ? ?She needs Rx RF on flonase for allergies and HCTZ for LE edema. She has been on that for many yrs. She takes 12.5 mg daily with relief. She exercises regularly. ? ?Doesn't have PCP.  ? ?Past Medical History:  ?Diagnosis Date  ? ASCUS of cervix with negative high risk HPV 04/29/2014  ? Fallopian  tube disorder 2013  ? focal epithelial hyperplasia of the tubes on pathology  ? Gestational diabetes   ? H/O cesarean section 93; 99; 02  ? x3  ? Hemorrhoids 06/2013  ? internal per colonoscopy  ? History of mammogram 9/15/15l 07/20/15  ? birad 2; benign  ? History of Papanicolaou smear of cervix 04/29/14; 07/20/15  ? ascus/neg; neg/neg  ? Irregular menses   ? Migraine   ? ? ?Past Surgical History:  ?Procedure Laterality Date  ? ABDOMINAL HYSTERECTOMY  07/01/2012  ? [place] C BIL TUBES REMOVED  ? CESAREAN SECTION    ? 93; 99; 02  ? COLONOSCOPY  06/2013  ? DR. ELLIOTT; INTERNAL HEMORRHOIDS; RECK IN 5HRS D/T FAM HX  ? TUBAL LIGATION  2002  ? ? ?Family History  ?Problem Relation Age of Onset  ? Diabetes Mother   ? Hypertension Mother   ? Diabetes Father   ? Heart disease Father   ? Hypertension Father   ? Breast cancer Neg Hx   ? ? ?Social History  ? ?Socioeconomic History  ? Marital status: Married  ?  Spouse name: Not on file  ? Number of children: 3  ? Years of education: 43  ? Highest education level: Not on file  ?Occupational History  ? Occupation: HOMEMAKER  ?Tobacco Use  ? Smoking status: Never  ? Smokeless tobacco: Never  ?Vaping Use  ?  Vaping Use: Never used  ?Substance and Sexual Activity  ? Alcohol use: Yes  ?  Comment: OCC  ? Drug use: No  ? Sexual activity: Yes  ?  Birth control/protection: Post-menopausal, Surgical  ?  Comment: Hysterectomy  ?Other Topics Concern  ? Not on file  ?Social History Narrative  ? Not on file  ? ?Social Determinants of Health  ? ?Financial Resource Strain: Not on file  ?Food Insecurity: Not on file  ?Transportation Needs: Not on file  ?Physical Activity: Not on file  ?Stress: Not on file  ?Social Connections: Not on file  ?Intimate Partner Violence: Not on file  ? ? ?Current Meds  ?Medication Sig  ? Cholecalciferol (VITAMIN D3) 2000 units TABS Take 1 tablet by mouth daily.  ? hydrocortisone-pramoxine (ANALPRAM-HC) 2.5-1 % rectal cream Place rectally 3 (three) times daily.  ?  ibuprofen (ADVIL,MOTRIN) 200 MG tablet Take by mouth.  ? SUPER B COMPLEX/C PO Take by mouth.  ? [DISCONTINUED] estradiol (VIVELLE-DOT) 0.025 MG/24HR PLACE 1 PATCH ON THE SKIN TWICE A WEEK  ? [DISCONTINUED] fluticasone (FLONASE) 50 MCG/ACT nasal spray Place 2 sprays into both nostrils daily.  ? [DISCONTINUED] hydrochlorothiazide (HYDRODIURIL) 25 MG tablet Take 0.5 tablets (12.5 mg total) by mouth daily.  ? [DISCONTINUED] LORazepam (ATIVAN) 0.5 MG tablet Take 1-2 tabs BID prn sx  ? ? ? ? ?ROS: ? ?Review of Systems  ?Constitutional:  Negative for fatigue, fever and unexpected weight change.  ?Respiratory:  Negative for cough, shortness of breath and wheezing.   ?Cardiovascular:  Negative for chest pain, palpitations and leg swelling.  ?Gastrointestinal:  Negative for blood in stool, constipation, diarrhea, nausea and vomiting.  ?Endocrine: Negative for cold intolerance, heat intolerance and polyuria.  ?Genitourinary:  Negative for dyspareunia, dysuria, flank pain, frequency, genital sores, hematuria, menstrual problem, pelvic pain, urgency, vaginal bleeding, vaginal discharge and vaginal pain.  ?Musculoskeletal:  Negative for arthralgias, back pain, joint swelling and myalgias.  ?Skin:  Positive for rash.  ?Neurological:  Negative for dizziness, syncope, light-headedness, numbness and headaches.  ?Hematological:  Negative for adenopathy.  ?Psychiatric/Behavioral:  Negative for agitation, confusion, sleep disturbance and suicidal ideas. The patient is not nervous/anxious.   ? ? ?Objective: ?BP 110/80   Ht 5\' 2"  (1.575 m)   Wt 196 lb (88.9 kg)   BMI 35.85 kg/m?  ? ? ?Physical Exam ?Constitutional:   ?   Appearance: She is well-developed.  ?Genitourinary:  ?   Vulva normal.  ?   Right Labia: No rash, tenderness or lesions. ?   Left Labia: No tenderness, lesions or rash. ?   No vaginal discharge, erythema or tenderness.  ? ?   Right Adnexa: not tender and no mass present. ?   Left Adnexa: not tender and no mass  present. ?   No cervical friability or polyp.  ?   Uterus is not enlarged or tender.  ?   Uterus is absent.  ?Breasts: ?   Right: No mass, nipple discharge, skin change or tenderness.  ?   Left: No mass, nipple discharge, skin change or tenderness.  ?Neck:  ?   Thyroid: No thyromegaly.  ?Cardiovascular:  ?   Rate and Rhythm: Normal rate and regular rhythm.  ?   Heart sounds: Normal heart sounds. No murmur heard. ?Pulmonary:  ?   Effort: Pulmonary effort is normal.  ?   Breath sounds: Normal breath sounds.  ?Abdominal:  ?   Palpations: Abdomen is soft.  ?   Tenderness: There is no  abdominal tenderness. There is no guarding or rebound.  ?Musculoskeletal:     ?   General: Normal range of motion.  ?   Cervical back: Normal range of motion.  ?Lymphadenopathy:  ?   Cervical: No cervical adenopathy.  ?Neurological:  ?   General: No focal deficit present.  ?   Mental Status: She is alert and oriented to person, place, and time.  ?   Cranial Nerves: No cranial nerve deficit.  ?Skin: ?   General: Skin is warm and dry.  ?Psychiatric:     ?   Mood and Affect: Mood normal.     ?   Behavior: Behavior normal.     ?   Thought Content: Thought content normal.     ?   Judgment: Judgment normal.  ?Vitals reviewed.  ? ? ?Assessment/Plan: ?Encounter for annual routine gynecological examination ? ?Encounter for screening mammogram for malignant neoplasm of breast - Plan: MM 3D SCREEN BREAST BILATERAL; pt current on mammo till 10/23 ? ?Screening for colon cancer - Plan: Ambulatory referral to Gastroenterology; refer to [facility].  ? ?Hormone replacement therapy (HRT) - Plan: estradiol (VIVELLE-DOT) 0.025 MG/24HR; Rx RF. Doing well.  ? ?Vasomotor symptoms due to menopause - Plan: estradiol (VIVELLE-DOT) 0.025 MG/24HR ? ?Anxiety - Plan: LORazepam (ATIVAN) 0.5 MG tablet; Rx RF. Takes sparingly ? ?Pre-diabetes - Plan: Hemoglobin A1c ? ?Elevated lipids - Plan: Lipid panel ? ?Lower leg edema - Plan: Comprehensive metabolic panel,  hydrochlorothiazide (HYDRODIURIL) 25 MG tablet; Rx RF. Cont exercise.  ? ?Environmental allergies - Plan: fluticasone (FLONASE) 50 MCG/ACT nasal spray ? ?Blood tests for routine general physical examination - Plan: Comprehensive metabolic

## 2021-11-06 ENCOUNTER — Other Ambulatory Visit: Payer: Self-pay

## 2021-11-06 ENCOUNTER — Ambulatory Visit (INDEPENDENT_AMBULATORY_CARE_PROVIDER_SITE_OTHER): Payer: 59 | Admitting: Obstetrics and Gynecology

## 2021-11-06 ENCOUNTER — Encounter: Payer: Self-pay | Admitting: Obstetrics and Gynecology

## 2021-11-06 VITALS — BP 110/80 | Ht 62.0 in | Wt 196.0 lb

## 2021-11-06 DIAGNOSIS — N951 Menopausal and female climacteric states: Secondary | ICD-10-CM

## 2021-11-06 DIAGNOSIS — Z79890 Hormone replacement therapy (postmenopausal): Secondary | ICD-10-CM | POA: Diagnosis not present

## 2021-11-06 DIAGNOSIS — Z0000 Encounter for general adult medical examination without abnormal findings: Secondary | ICD-10-CM

## 2021-11-06 DIAGNOSIS — Z01419 Encounter for gynecological examination (general) (routine) without abnormal findings: Secondary | ICD-10-CM

## 2021-11-06 DIAGNOSIS — F419 Anxiety disorder, unspecified: Secondary | ICD-10-CM

## 2021-11-06 DIAGNOSIS — Z1231 Encounter for screening mammogram for malignant neoplasm of breast: Secondary | ICD-10-CM | POA: Diagnosis not present

## 2021-11-06 DIAGNOSIS — R7303 Prediabetes: Secondary | ICD-10-CM

## 2021-11-06 DIAGNOSIS — E785 Hyperlipidemia, unspecified: Secondary | ICD-10-CM

## 2021-11-06 DIAGNOSIS — R600 Localized edema: Secondary | ICD-10-CM

## 2021-11-06 DIAGNOSIS — Z1211 Encounter for screening for malignant neoplasm of colon: Secondary | ICD-10-CM

## 2021-11-06 DIAGNOSIS — Z9109 Other allergy status, other than to drugs and biological substances: Secondary | ICD-10-CM

## 2021-11-06 MED ORDER — ESTRADIOL 0.025 MG/24HR TD PTTW: MEDICATED_PATCH | TRANSDERMAL | 3 refills | Status: DC

## 2021-11-06 MED ORDER — HYDROCHLOROTHIAZIDE 25 MG PO TABS: 12.5000 mg | ORAL_TABLET | Freq: Every day | ORAL | 1 refills | Status: DC

## 2021-11-06 MED ORDER — FLUTICASONE PROPIONATE 50 MCG/ACT NA SUSP: 2.0000 | Freq: Every day | NASAL | 5 refills | Status: DC

## 2021-11-06 MED ORDER — LORAZEPAM 0.5 MG PO TABS: ORAL_TABLET | ORAL | 0 refills | Status: DC

## 2021-11-06 NOTE — Patient Instructions (Signed)
I value your feedback and you entrusting us with your care. If you get a [facility] patient survey, I would appreciate you taking the time to let us know about your experience today. Thank you! ? ? ?

## 2021-11-07 LAB — HEMOGLOBIN A1C
Est. average glucose Bld gHb Est-mCnc: 111 mg/dL
Hgb A1c MFr Bld: 5.5 % (ref 4.8–5.6)

## 2021-11-07 LAB — COMPREHENSIVE METABOLIC PANEL
ALT: 16 IU/L (ref 0–32)
AST: 22 IU/L (ref 0–40)
Albumin/Globulin Ratio: 2 (ref 1.2–2.2)
Albumin: 5.3 g/dL — ABNORMAL HIGH (ref 3.8–4.9)
Alkaline Phosphatase: 67 IU/L (ref 44–121)
BUN/Creatinine Ratio: 20 (ref 9–23)
BUN: 14 mg/dL (ref 6–24)
Bilirubin Total: 0.4 mg/dL (ref 0.0–1.2)
CO2: 23 mmol/L (ref 20–29)
Calcium: 10.1 mg/dL (ref 8.7–10.2)
Chloride: 102 mmol/L (ref 96–106)
Creatinine, Ser: 0.71 mg/dL (ref 0.57–1.00)
Globulin, Total: 2.7 g/dL (ref 1.5–4.5)
Glucose: 94 mg/dL (ref 70–99)
Potassium: 4.2 mmol/L (ref 3.5–5.2)
Sodium: 144 mmol/L (ref 134–144)
Total Protein: 8 g/dL (ref 6.0–8.5)
eGFR: 98 mL/min/{1.73_m2} (ref >59)

## 2021-11-07 LAB — LIPID PANEL
Chol/HDL Ratio: 3.2 ratio (ref 0.0–4.4)
Cholesterol, Total: 237 mg/dL — ABNORMAL HIGH (ref 100–199)
HDL: 73 mg/dL (ref >39)
LDL Chol Calc (NIH): 137 mg/dL — ABNORMAL HIGH (ref 0–99)
Triglycerides: 154 mg/dL — ABNORMAL HIGH (ref 0–149)
VLDL Cholesterol Cal: 27 mg/dL (ref 5–40)

## 2021-12-01 ENCOUNTER — Ambulatory Visit: Payer: 59 | Admitting: Family Medicine

## 2021-12-01 ENCOUNTER — Encounter: Payer: Self-pay | Admitting: Family Medicine

## 2021-12-01 VITALS — BP 120/70 | HR 97 | Temp 98.0°F | Ht 62.0 in | Wt 195.2 lb

## 2021-12-01 DIAGNOSIS — F419 Anxiety disorder, unspecified: Secondary | ICD-10-CM | POA: Diagnosis not present

## 2021-12-01 DIAGNOSIS — E785 Hyperlipidemia, unspecified: Secondary | ICD-10-CM | POA: Diagnosis not present

## 2021-12-01 DIAGNOSIS — Z9109 Other allergy status, other than to drugs and biological substances: Secondary | ICD-10-CM

## 2021-12-01 DIAGNOSIS — R600 Localized edema: Secondary | ICD-10-CM

## 2021-12-01 DIAGNOSIS — Z23 Encounter for immunization: Secondary | ICD-10-CM | POA: Diagnosis not present

## 2021-12-01 DIAGNOSIS — E669 Obesity, unspecified: Secondary | ICD-10-CM

## 2021-12-01 NOTE — Assessment & Plan Note (Signed)
Seems to be related to caring for her mother.  She rarely uses Ativan.  Discussed the risk of addiction and dependence with this medication.  Discussed if she has to use this more frequently we will need to consider an alternative medication. ?

## 2021-12-01 NOTE — Patient Instructions (Signed)
Nice to see you. ?Please discontinue the HCTZ.  If your swelling returns please let us know. ?Please continue to work on diet and exercise. ?

## 2021-12-01 NOTE — Addendum Note (Signed)
Addended by: Charlyne Mom D on: 12/01/2021 03:25 PM ? ? Modules accepted: Orders ? ?

## 2021-12-01 NOTE — Assessment & Plan Note (Signed)
Chronic issue though does not have symptoms at this time.  She has been on HCTZ for quite some time.  Discussed she could discontinue this.  If the swelling comes back we will need to consider further evaluation. ?

## 2021-12-01 NOTE — Progress Notes (Addendum)
?Marikay Alar, MD ?Phone: [phone] ? ?Kelly Welch is a 61 y.o. female who presents today for new patient visit. ? ?Bilateral ankle swelling: Patient reports she has been on HCTZ half a tablet for years.  Notes it seemed to help initially though she notes no swelling at this time.  No chest pain, shortness of breath, orthopnea, or PND. ? ?Allergies: Patient reports allergies to pollen.  Flonase and Zyrtec are helpful.  She will typically have headaches, sore throat, and postnasal drip when this occurs. ? ?Anxiety: Patient rarely takes Ativan.  The anxiety is related to caring for her mother.  The Ativan helps her relax.  A 30 pills prescription lasts her more than a year. ? ?Obesity: Patient has been walking 2-3 times per week for exercise.  She tried to limit fried fatty foods.  She is cutting back on cheese intake.  She is drinking more water.  She is cut down on soda intake.  She does drink 1 diet soda per day. ? ?The 10-year ASCVD risk score (Arnett DK, et al., 2019) is: 2.5% ?  Values used to calculate the score: ?    Age: 33 years ?    Sex: Female ?    Is Non-Hispanic African American: No ?    Diabetic: No ?    Tobacco smoker: No ?    Systolic Blood Pressure: 120 mmHg ?    Is BP treated: No ?    HDL Cholesterol: 73 mg/dL ?    Total Cholesterol: 237 mg/dL ? ? ?Active Ambulatory Problems  ?  Diagnosis Date Noted  ? Hormone replacement therapy (HRT) 08/28/2017  ? Vasomotor symptoms due to menopause 08/28/2017  ? Chronic bilateral low back pain with left-sided sciatica 08/28/2017  ? Lower leg edema 08/28/2017  ? Environmental allergies 08/28/2017  ? Anxiety 10/28/2019  ? Elevated lipids 11/01/2020  ? Pre-diabetes 11/01/2020  ? Obesity (BMI 35.0-39.9 without comorbidity) 12/01/2021  ? ?Resolved Ambulatory Problems  ?  Diagnosis Date Noted  ? No Resolved Ambulatory Problems  ? ?Past Medical History:  ?Diagnosis Date  ? Arthritis   ? ASCUS of cervix with negative high risk HPV 04/29/2014  ? Chicken pox    ? Fallopian tube disorder 2013  ? Gestational diabetes   ? H/O cesarean section 93; 99; 02  ? Hemorrhoids 06/2013  ? History of mammogram 9/15/15l 07/20/15  ? History of Papanicolaou smear of cervix 04/29/14; 07/20/15  ? Irregular menses   ? Migraine   ? ? ?Family History  ?Problem Relation Age of Onset  ? Diabetes Mother   ? Hypertension Mother   ? Diabetes Father   ? Heart disease Father   ? Hypertension Father   ? Depression Maternal Grandmother   ? Breast cancer Neg Hx   ? ? ?Social History  ? ?Socioeconomic History  ? Marital status: Married  ?  Spouse name: Not on file  ? Number of children: 3  ? Years of education: 75  ? Highest education level: Not on file  ?Occupational History  ? Occupation: HOMEMAKER  ?Tobacco Use  ? Smoking status: Never  ? Smokeless tobacco: Never  ?Vaping Use  ? Vaping Use: Never used  ?Substance and Sexual Activity  ? Alcohol use: Yes  ?  Comment: OCC  ? Drug use: No  ? Sexual activity: Yes  ?  Birth control/protection: Post-menopausal, Surgical  ?  Comment: Hysterectomy  ?Other Topics Concern  ? Not on file  ?Social History Narrative  ? Not  on file  ? ?Social Determinants of Health  ? ?Financial Resource Strain: Not on file  ?Food Insecurity: Not on file  ?Transportation Needs: Not on file  ?Physical Activity: Not on file  ?Stress: Not on file  ?Social Connections: Not on file  ?Intimate Partner Violence: Not on file  ? ? ?ROS ?See HPI ? ?Objective ? ?Physical Exam ?Vitals:  ? 12/01/21 1445  ?BP: 120/70  ?Pulse: 97  ?Temp: 98 ?F (36.7 ?C)  ?SpO2: 97%  ? ? ?BP Readings from Last 3 Encounters:  ?12/01/21 120/70  ?11/06/21 110/80  ?11/02/20 130/80  ? ?Wt Readings from Last 3 Encounters:  ?12/01/21 195 lb 3.2 oz (88.5 kg)  ?11/06/21 196 lb (88.9 kg)  ?11/02/20 187 lb (84.8 kg)  ? ? ?Physical Exam ?Constitutional:   ?   General: She is not in acute distress. ?   Appearance: She is not diaphoretic.  ?Cardiovascular:  ?   Rate and Rhythm: Normal rate and regular rhythm.  ?   Heart sounds:  Normal heart sounds.  ?Pulmonary:  ?   Effort: Pulmonary effort is normal.  ?   Breath sounds: Normal breath sounds.  ?Musculoskeletal:  ?   Right lower leg: No edema.  ?   Left lower leg: No edema.  ?Skin: ?   General: Skin is warm and dry.  ?Neurological:  ?   Mental Status: She is alert.  ? ? ? ?Assessment/Plan:  ? ?Problem List Items Addressed This Visit   ? ? Anxiety (Chronic)  ?  Seems to be related to caring for her mother.  She rarely uses Ativan.  Discussed the risk of addiction and dependence with this medication.  Discussed if she has to use this more frequently we will need to consider an alternative medication. ? ?  ?  ? Environmental allergies (Chronic)  ?  Adequately controlled on Flonase and Zyrtec.  She can continue both of these. ? ?  ?  ? Lower leg edema (Chronic)  ?  Chronic issue though does not have symptoms at this time.  She has been on HCTZ for quite some time.  Discussed she could discontinue this.  If the swelling comes back we will need to consider further evaluation. ? ?  ?  ? Obesity (BMI 35.0-39.9 without comorbidity) (Chronic)  ?  Encouraged continued diet and exercise changes.  Discussed trying to eliminate sodas. ? ?  ?  ? Elevated lipids  ?  Patient does not meet criteria for medication based on her ASCVD risk score.  She will continue to work on diet and exercise. ? ?  ?  ? ? ?Return in about 3 months (around 03/02/2022) for Weight follow-up. ? ?This visit occurred during the SARS-CoV-2 public health emergency.  Safety protocols were in place, including screening questions prior to the visit, additional usage of staff PPE, and extensive cleaning of exam room while observing appropriate contact time as indicated for disinfecting solutions.  ? ? ?Marikay Alar, MD ?[facility] ? ?

## 2021-12-01 NOTE — Assessment & Plan Note (Signed)
Patient does not meet criteria for medication based on her ASCVD risk score.  She will continue to work on diet and exercise. ?

## 2021-12-01 NOTE — Assessment & Plan Note (Signed)
Encouraged continued diet and exercise changes.  Discussed trying to eliminate sodas. ?

## 2021-12-01 NOTE — Assessment & Plan Note (Signed)
Adequately controlled on Flonase and Zyrtec.  She can continue both of these. ?

## 2022-05-08 ENCOUNTER — Ambulatory Visit: Payer: 59 | Admitting: Family Medicine

## 2022-10-14 ENCOUNTER — Other Ambulatory Visit: Payer: Self-pay | Admitting: Obstetrics and Gynecology

## 2022-10-14 DIAGNOSIS — Z79890 Hormone replacement therapy (postmenopausal): Secondary | ICD-10-CM

## 2022-10-14 DIAGNOSIS — N951 Menopausal and female climacteric states: Secondary | ICD-10-CM

## 2022-10-19 ENCOUNTER — Other Ambulatory Visit: Payer: Self-pay | Admitting: Obstetrics and Gynecology

## 2022-10-19 DIAGNOSIS — Z79890 Hormone replacement therapy (postmenopausal): Secondary | ICD-10-CM

## 2022-10-19 DIAGNOSIS — N951 Menopausal and female climacteric states: Secondary | ICD-10-CM

## 2022-11-19 ENCOUNTER — Other Ambulatory Visit: Payer: Self-pay | Admitting: Obstetrics and Gynecology

## 2022-11-19 DIAGNOSIS — Z1231 Encounter for screening mammogram for malignant neoplasm of breast: Secondary | ICD-10-CM

## 2022-12-07 ENCOUNTER — Ambulatory Visit
Admission: RE | Admit: 2022-12-07 | Discharge: 2022-12-07 | Disposition: A | Discharge status: Home / Self Care | Payer: 59 | Source: Ambulatory Visit | Attending: Obstetrics and Gynecology | Admitting: Obstetrics and Gynecology

## 2022-12-07 DIAGNOSIS — Z1231 Encounter for screening mammogram for malignant neoplasm of breast: Secondary | ICD-10-CM | POA: Diagnosis present

## 2022-12-19 ENCOUNTER — Other Ambulatory Visit: Payer: Self-pay | Admitting: Obstetrics and Gynecology

## 2022-12-19 DIAGNOSIS — Z79890 Hormone replacement therapy (postmenopausal): Secondary | ICD-10-CM

## 2022-12-19 DIAGNOSIS — N951 Menopausal and female climacteric states: Secondary | ICD-10-CM

## 2023-01-01 NOTE — Progress Notes (Signed)
PCP: Glori Luis, MD   Chief Complaint  Patient presents with   Gynecologic Exam    No concerns    HPI:      [name] is a 62 y.o. [phone] who LMP was No LMP recorded. Patient has had a hysterectomy., presents today for her annual examination.  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 She does not have PMB.  She does not have vasomotor sx. She uses vivelle dot 0.025 mg with sx relief, tried to wean off but vasomotor sx were bad. Now trying 1/2 patch. Needs Rx RF.  Sex activity: single partner, contraception - status post hysterectomy. She does not have vaginal dryness.  Last Pap: 09/29/18  Results were: no abnormalities /neg HPV DNA. Still has cx. Hx of STDs: none  Last mammogram: 12/07/22  Results were: normal--routine follow-up in 12 months There is no FH of breast cancer. There is no FH of ovarian cancer. The patient does self-breast exams.   Colonoscopy: colonoscopy 2023/2024 (can't see in Epic), pt states repeat due after ? 1 yr.  11/14 with Dr. Mechele Collin without abnormalities with repeat due after 5 years due to [facility] of polyps.   Tobacco use: The patient denies current or previous tobacco use. Alcohol use: none No drug use Exercise: moderately active  She does get adequate calcium and Vitamin D supp in her diet.  Borderline lipids 2016-2023. Pre-DM on 12/17 labs (HgA1C=5.7%) and 2021, 2022;  normal in 2018, 2020, 2023; hx of gestational DM. Due for repeat labs this yr.   Pt also with anxiety issues due to being caregiver. Doesn't need daily meds, but takes ativan very sparingly. Needs Rx RF.  She needs Rx RF on flonase for allergies and HCTZ 12.5 mg for LE edema/tinnitus. She has been on that for many yrs.   Doesn't have PCP.   Past Medical History:  Diagnosis Date   Arthritis    ASCUS of cervix with negative high risk HPV 04/29/2014   Chicken pox    Fallopian tube disorder 2013   focal epithelial  hyperplasia of the tubes on pathology   Gestational diabetes    H/O cesarean section 93; 99; 02   x3   Hemorrhoids 06/2013   internal per colonoscopy   History of mammogram 9/15/15l 07/20/15   birad 2; benign   History of Papanicolaou smear of cervix 04/29/14; 07/20/15   ascus/neg; neg/neg   Irregular menses    Migraine     Past Surgical History:  Procedure Laterality Date   ABDOMINAL HYSTERECTOMY  07/01/2012   LSH C BIL TUBES REMOVED   CESAREAN SECTION     93; 99; 02   COLONOSCOPY  06/2013   DR. ELLIOTT; INTERNAL HEMORRHOIDS; RECK IN 5HRS D/T FAM HX   TUBAL LIGATION  2002    Family History  Problem Relation Age of Onset   Diabetes Mother    Hypertension Mother    Dementia Mother    Diabetes Father    Heart disease Father    Hypertension Father    Depression Maternal Grandmother    Breast cancer Neg Hx     Social History   Socioeconomic History   Marital status: Married    Spouse name: Not on file   Number of children: 3   Years of education: 14   Highest education level: Not on file  Occupational History   Occupation: HOMEMAKER  Tobacco Use   Smoking status:  Never   Smokeless tobacco: Never  Vaping Use   Vaping Use: Never used  Substance and Sexual Activity   Alcohol use: Yes    Comment: OCC   Drug use: No   Sexual activity: Yes    Birth control/protection: Post-menopausal, Surgical    Comment: Hysterectomy  Other Topics Concern   Not on file  Social History Narrative   Not on file   Social Determinants of Health   Financial Resource Strain: Not on file  Food Insecurity: Not on file  Transportation Needs: Not on file  Physical Activity: Insufficiently Active (08/28/2017)   Exercise Vital Sign    Days of Exercise per Week: 3 days    Minutes of Exercise per Session: 30 min  Stress: Stress Concern Present (08/28/2017)   [name] of Occupational Health - Occupational Stress Questionnaire    Feeling of Stress : Rather much  Social  Connections: Moderately Integrated (08/28/2017)   Social Connection and Isolation Panel [NHANES]    Frequency of Communication with Friends and Family: More than three times a week    Frequency of Social Gatherings with Friends and Family: Never    Attends Religious Services: More than 4 times per year    Active Member of [employer] or Organizations: No    Attends Banker Meetings: Never    Marital Status: Married  Catering manager Violence: Not At Risk (08/28/2017)   Humiliation, Afraid, Rape, and Kick questionnaire    Fear of Current or Ex-Partner: No    Emotionally Abused: No    Physically Abused: No    Sexually Abused: No    Current Meds  Medication Sig   ascorbic acid (VITAMIN C) 1000 MG tablet Take by mouth.   cetirizine (ZYRTEC) 10 MG chewable tablet Chew by mouth.   Cholecalciferol (VITAMIN D3) 2000 units TABS Take 1 tablet by mouth daily.   hydrocortisone-pramoxine (ANALPRAM-HC) 2.5-1 % rectal cream Place rectally 3 (three) times daily.   ibuprofen (ADVIL,MOTRIN) 200 MG tablet Take by mouth.   SUPER B COMPLEX/C PO Take by mouth.   Vitamin E (VITAMIN E/D-ALPHA NATURAL) 268 MG (400 UNIT) CAPS Take by mouth.   [DISCONTINUED] DOTTI 0.025 MG/24HR APPLY 1 PATCH TOPICALLY TO SKIN TWICE A WEEK   [DISCONTINUED] fluticasone (FLONASE) 50 MCG/ACT nasal spray Place 2 sprays into both nostrils daily.   [DISCONTINUED] hydrochlorothiazide (HYDRODIURIL) 25 MG tablet Take 12.5 mg by mouth daily.   [DISCONTINUED] LORazepam (ATIVAN) 0.5 MG tablet Take 1-2 tabs BID prn sx      ROS:  Review of Systems  Constitutional:  Negative for fatigue, fever and unexpected weight change.  Respiratory:  Negative for cough, shortness of breath and wheezing.   Cardiovascular:  Negative for chest pain, palpitations and leg swelling.  Gastrointestinal:  Negative for blood in stool, constipation, diarrhea, nausea and vomiting.  Endocrine: Negative for cold intolerance, heat intolerance and polyuria.   Genitourinary:  Negative for dyspareunia, dysuria, flank pain, frequency, genital sores, hematuria, menstrual problem, pelvic pain, urgency, vaginal bleeding, vaginal discharge and vaginal pain.  Musculoskeletal:  Negative for arthralgias, back pain, joint swelling and myalgias.  Skin:  Positive for rash.  Neurological:  Negative for dizziness, syncope, light-headedness, numbness and headaches.  Hematological:  Negative for adenopathy.  Psychiatric/Behavioral:  Negative for agitation, confusion, sleep disturbance and suicidal ideas. The patient is not nervous/anxious.      Objective: BP 110/70   Ht 5\' 2"  (1.575 m)   Wt 187 lb (84.8 kg)   BMI 34.20 kg/m  Physical Exam Constitutional:      Appearance: She is well-developed.  Genitourinary:     Vulva normal.     Genitourinary Comments: UTERUS SURG REM     Right Labia: No rash, tenderness or lesions.    Left Labia: No tenderness, lesions or rash.    Vaginal cuff intact.    No vaginal discharge, erythema or tenderness.      Right Adnexa: not tender and no mass present.    Left Adnexa: not tender and no mass present.    Cervix is not parous.     No cervical friability.     Uterus is absent.  Breasts:    Right: No mass, nipple discharge, skin change or tenderness.     Left: No mass, nipple discharge, skin change or tenderness.  Neck:     Thyroid: No thyromegaly.  Cardiovascular:     Rate and Rhythm: Normal rate and regular rhythm.     Heart sounds: Normal heart sounds. No murmur heard. Pulmonary:     Effort: Pulmonary effort is normal.     Breath sounds: Normal breath sounds.  Abdominal:     Palpations: Abdomen is soft.     Tenderness: There is no abdominal tenderness. There is no guarding.  Musculoskeletal:        General: Normal range of motion.     Cervical back: Normal range of motion.  Neurological:     General: No focal deficit present.     Mental Status: She is alert and oriented to person, place, and time.      Cranial Nerves: No cranial nerve deficit.  Skin:    General: Skin is warm and dry.  Psychiatric:        Mood and Affect: Mood normal.        Behavior: Behavior normal.        Thought Content: Thought content normal.        Judgment: Judgment normal.  Vitals reviewed.     Assessment/Plan: Encounter for annual routine gynecological examination  Cervical cancer screening - Plan: Cytology - PAP  Screening for HPV (human papillomavirus) - Plan: Cytology - PAP  Encounter for screening mammogram for malignant neoplasm of breast  Hormone replacement therapy (HRT) - Plan: estradiol (DOTTI) 0.025 MG/24HR; Rx RF. Pt trying to do 1/2 patch for now.   Vasomotor symptoms due to menopause - Plan: estradiol (DOTTI) 0.025 MG/24HR  Anxiety - Plan: LORazepam (ATIVAN) 0.5 MG tablet; Rx RF, pt takes sparingly  Pre-diabetes - Plan: Hemoglobin A1c  Elevated lipids - Plan: Lipid panel  Lower leg edema - Plan: hydrochlorothiazide (HYDRODIURIL) 25 MG tablet, Comprehensive metabolic panel; check labs, Rx RF.   Environmental allergies - Plan: fluticasone (FLONASE) 50 MCG/ACT nasal spray; Rx RF  Blood tests for routine general physical examination - Plan: Comprehensive metabolic panel, Lipid panel, Hemoglobin A1c    Meds ordered this encounter  Medications   estradiol (DOTTI) 0.025 MG/24HR    Sig: APPLY 1 PATCH TOPICALLY TO SKIN TWICE A WEEK    Dispense:  24 patch    Refill:  2    Order Specific Question:   Supervising Provider    Answer:   Hildred Laser [[id number]]   fluticasone (FLONASE) 50 MCG/ACT nasal spray    Sig: Place 2 sprays into both nostrils daily.    Dispense:  32 g    Refill:  5    Order Specific Question:   Supervising Provider    Answer:   Hildred Laser [[id number]]  hydrochlorothiazide (HYDRODIURIL) 25 MG tablet    Sig: Take 0.5 tablets (12.5 mg total) by mouth daily.    Dispense:  90 tablet    Refill:  3    Order Specific Question:   Supervising Provider    Answer:   Hildred Laser [[id number]]   LORazepam (ATIVAN) 0.5 MG tablet    Sig: Take 1-2 tabs BID prn sx    Dispense:  30 tablet    Refill:  0    Order Specific Question:   Supervising Provider    Answer:   Waymon Budge           GYN counsel breast self exam, mammography screening, menopause, adequate intake of calcium and vitamin D, diet and exercise    F/U  Return in about 1 year (around 01/03/2024).  Aubry Tucholski B. Omeka Holben, PA-C 01/03/2023 11:32 AM

## 2023-01-03 ENCOUNTER — Ambulatory Visit (INDEPENDENT_AMBULATORY_CARE_PROVIDER_SITE_OTHER): Payer: 59 | Admitting: Obstetrics and Gynecology

## 2023-01-03 ENCOUNTER — Encounter: Payer: Self-pay | Admitting: Obstetrics and Gynecology

## 2023-01-03 ENCOUNTER — Other Ambulatory Visit (HOSPITAL_COMMUNITY)
Admission: RE | Admit: 2023-01-03 | Discharge: 2023-01-03 | Disposition: A | Discharge status: Home / Self Care | Payer: 59 | Source: Ambulatory Visit | Attending: Obstetrics and Gynecology | Admitting: Obstetrics and Gynecology

## 2023-01-03 VITALS — BP 110/70 | Ht 62.0 in | Wt 187.0 lb

## 2023-01-03 DIAGNOSIS — Z1211 Encounter for screening for malignant neoplasm of colon: Secondary | ICD-10-CM

## 2023-01-03 DIAGNOSIS — Z124 Encounter for screening for malignant neoplasm of cervix: Secondary | ICD-10-CM

## 2023-01-03 DIAGNOSIS — Z01419 Encounter for gynecological examination (general) (routine) without abnormal findings: Secondary | ICD-10-CM

## 2023-01-03 DIAGNOSIS — Z9109 Other allergy status, other than to drugs and biological substances: Secondary | ICD-10-CM

## 2023-01-03 DIAGNOSIS — F419 Anxiety disorder, unspecified: Secondary | ICD-10-CM

## 2023-01-03 DIAGNOSIS — Z0000 Encounter for general adult medical examination without abnormal findings: Secondary | ICD-10-CM

## 2023-01-03 DIAGNOSIS — N951 Menopausal and female climacteric states: Secondary | ICD-10-CM

## 2023-01-03 DIAGNOSIS — Z1151 Encounter for screening for human papillomavirus (HPV): Secondary | ICD-10-CM | POA: Diagnosis present

## 2023-01-03 DIAGNOSIS — Z79890 Hormone replacement therapy (postmenopausal): Secondary | ICD-10-CM

## 2023-01-03 DIAGNOSIS — E785 Hyperlipidemia, unspecified: Secondary | ICD-10-CM

## 2023-01-03 DIAGNOSIS — R7303 Prediabetes: Secondary | ICD-10-CM

## 2023-01-03 DIAGNOSIS — R600 Localized edema: Secondary | ICD-10-CM

## 2023-01-03 DIAGNOSIS — Z1231 Encounter for screening mammogram for malignant neoplasm of breast: Secondary | ICD-10-CM

## 2023-01-03 MED ORDER — LORAZEPAM 0.5 MG PO TABS: ORAL_TABLET | ORAL | 0 refills | Status: DC

## 2023-01-03 MED ORDER — FLUTICASONE PROPIONATE 50 MCG/ACT NA SUSP: 2.0000 | Freq: Every day | NASAL | 5 refills | Status: DC

## 2023-01-03 MED ORDER — ESTRADIOL 0.025 MG/24HR TD PTTW: MEDICATED_PATCH | TRANSDERMAL | 2 refills | Status: DC

## 2023-01-03 MED ORDER — HYDROCHLOROTHIAZIDE 25 MG PO TABS: 12.5000 mg | ORAL_TABLET | Freq: Every day | ORAL | 3 refills | Status: AC

## 2023-01-03 NOTE — Patient Instructions (Signed)
I value your feedback and you entrusting us with your care. If you get a  patient survey, I would appreciate you taking the time to let us know about your experience today. Thank you! ? ? ?

## 2023-01-07 LAB — CYTOLOGY - PAP
Adequacy: ABSENT
Comment: NEGATIVE
Diagnosis: NEGATIVE
High risk HPV: NEGATIVE

## 2023-04-18 IMAGING — MG MM DIGITAL SCREENING BILAT W/ TOMO AND CAD
8 series · 8 of 24 positions shown · non-contrast
Comparison: Previous exam(s).

CLINICAL DATA: Screening.

EXAM:
DIGITAL SCREENING BILATERAL MAMMOGRAM WITH TOMOSYNTHESIS AND CAD
TECHNIQUE: Bilateral screening digital craniocaudal and mediolateral oblique
mammograms were obtained. Bilateral screening digital breast
tomosynthesis was performed. The images were evaluated with
computer-aided detection.

[L MLO synth-2D]
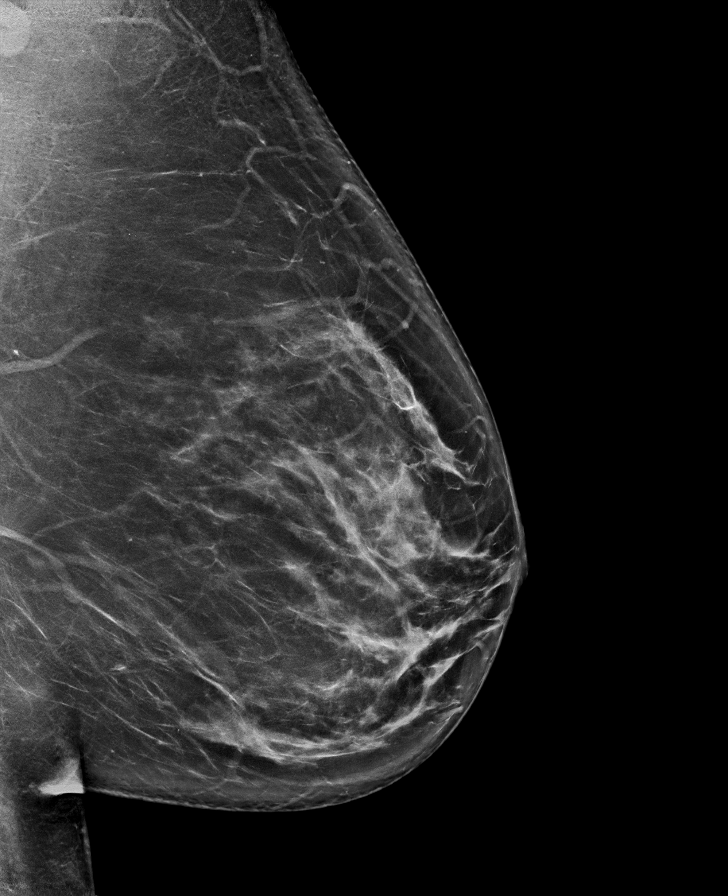

[R CC synth-2D]
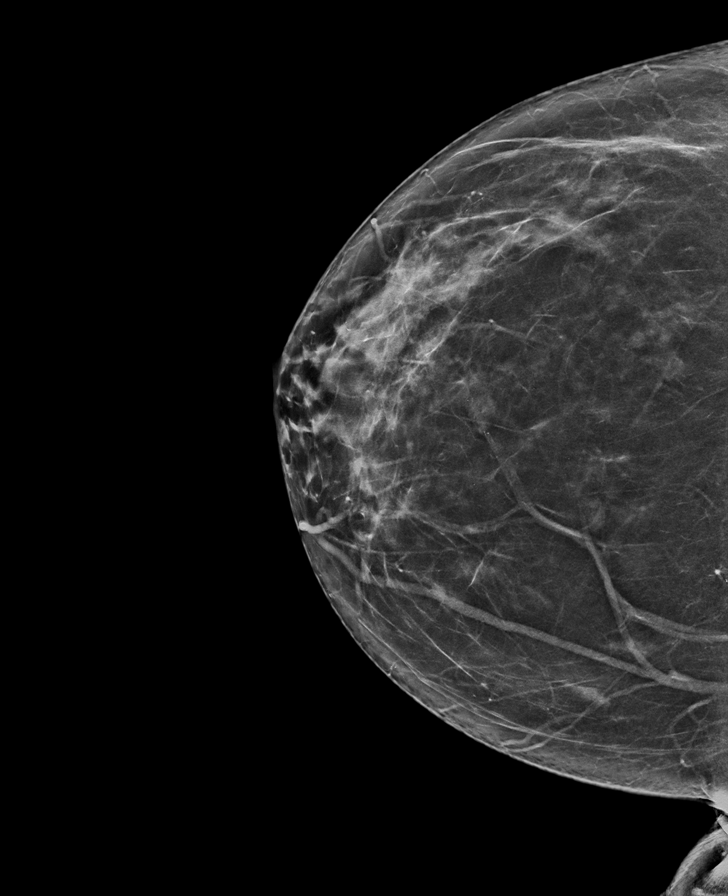

[R MLO synth-2D]
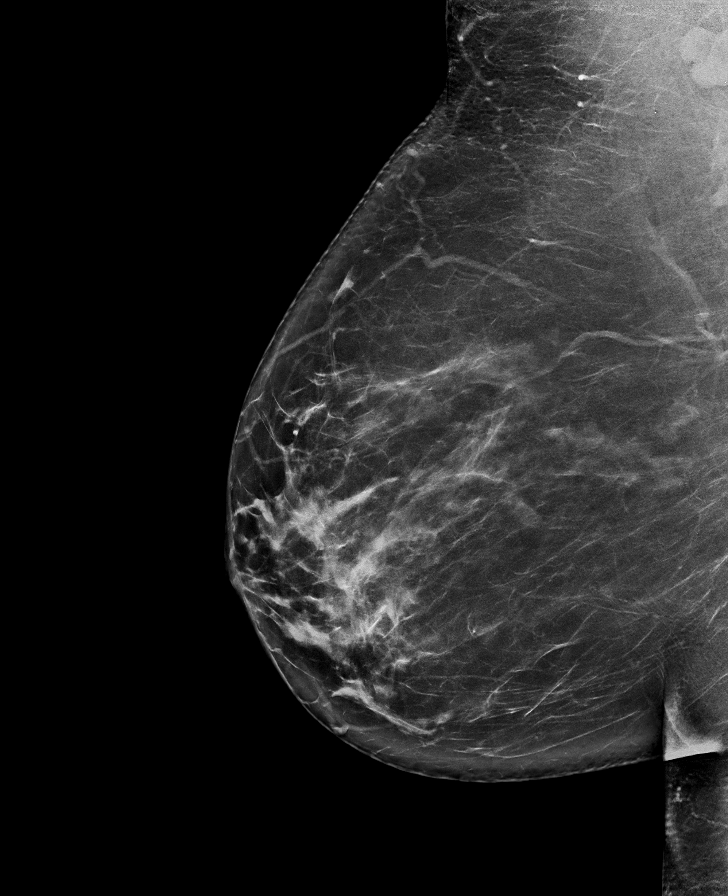

[L CC synth-2D]
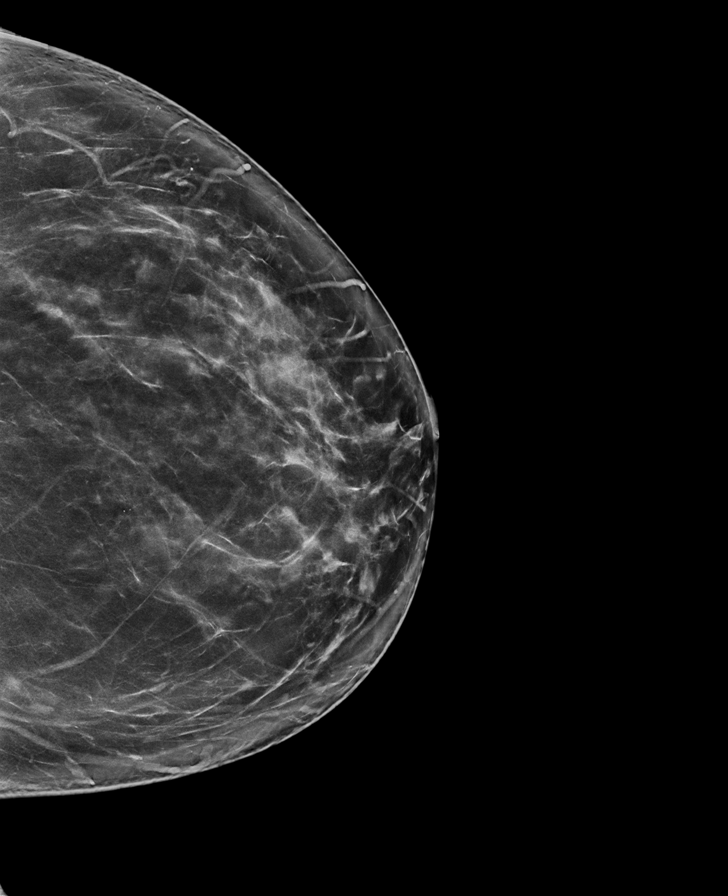

[R MLO tomo · tomo slice 45/90.0]
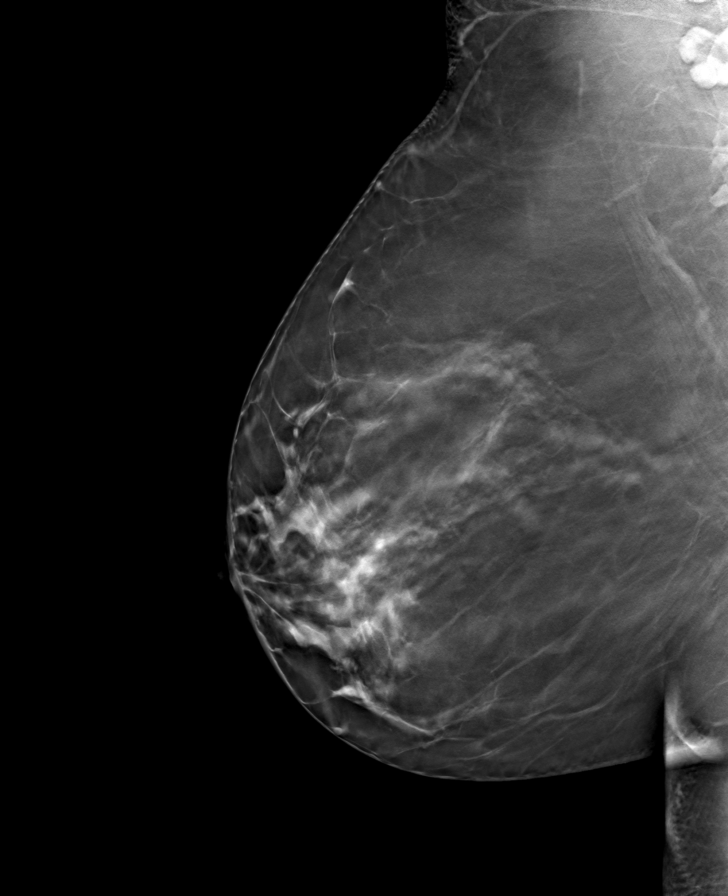

[L CC tomo · tomo slice 43/85.0]
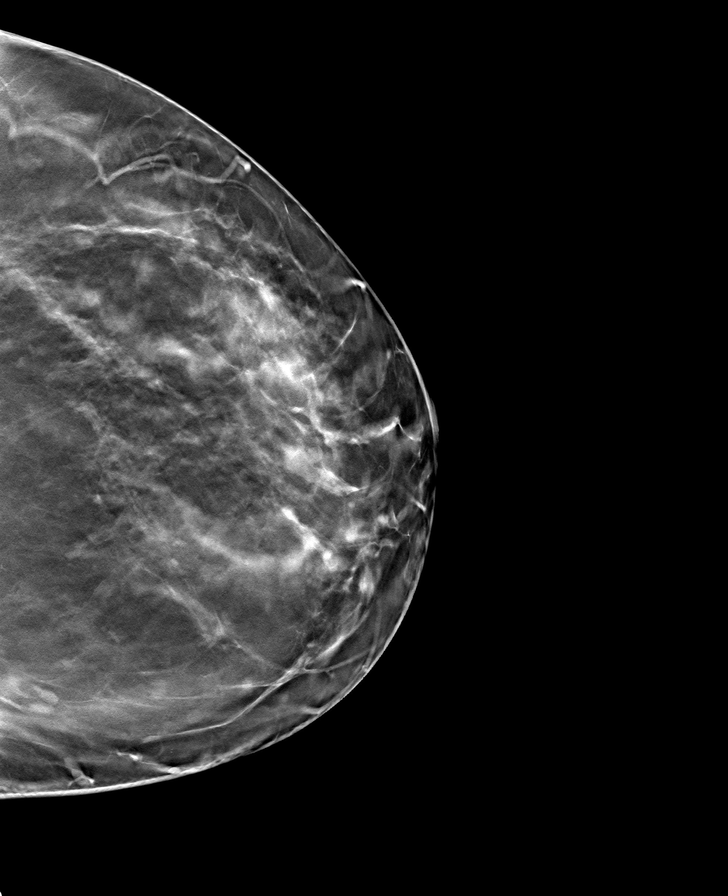

[L MLO tomo · tomo slice 47/93.0]
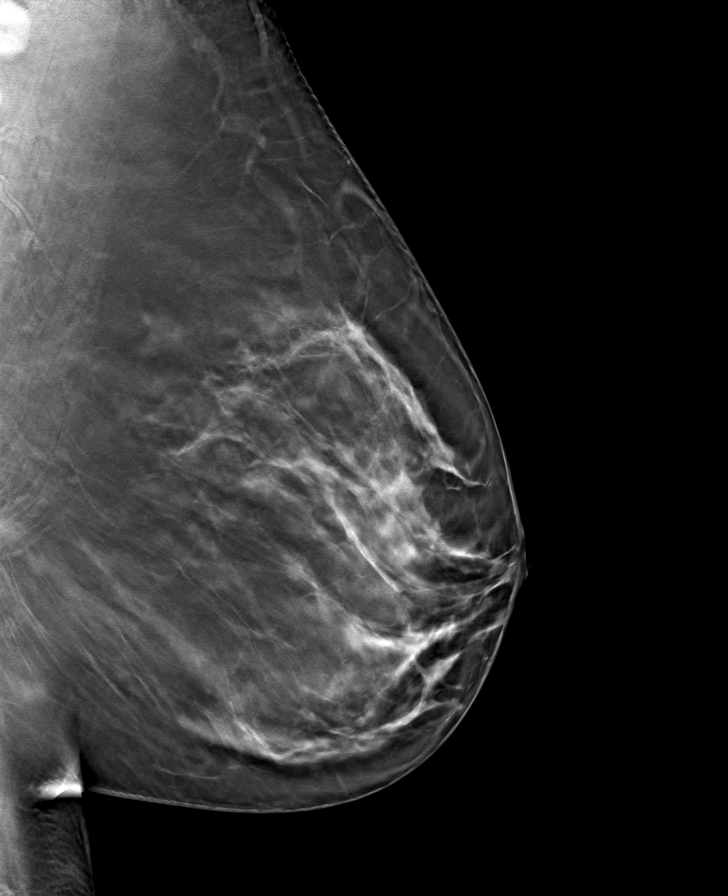

[R CC tomo · tomo slice 39/76.0]
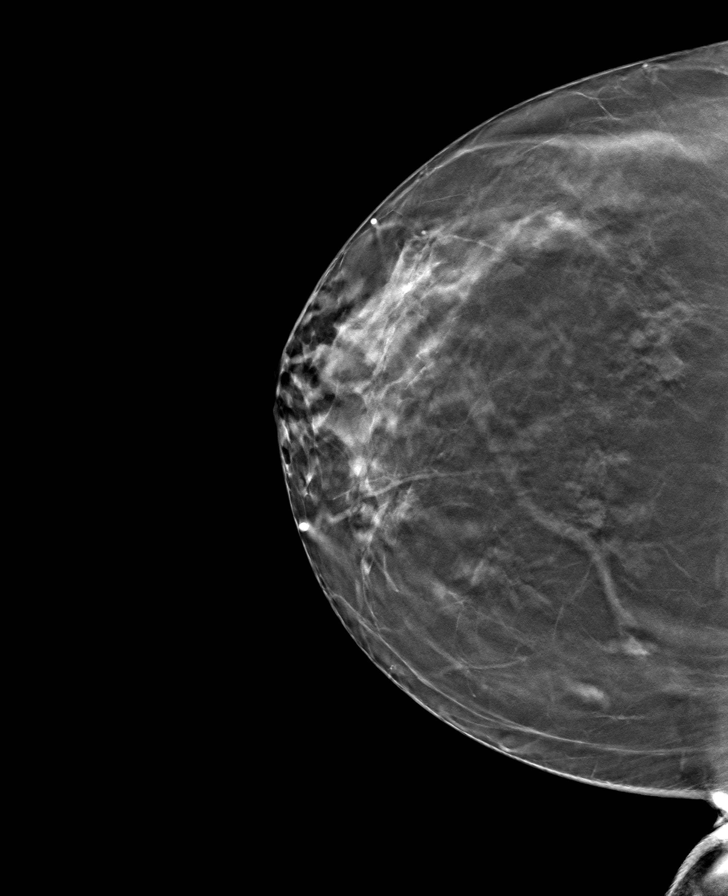

[8 of 24 positions shown; findings below may reference images not displayed]

ACR Breast Density Category b: There are scattered areas of
fibroglandular density.
FINDINGS: There are no findings suspicious for malignancy.
IMPRESSION: No mammographic evidence of malignancy. A result letter of this
screening mammogram will be mailed directly to the patient.

RECOMMENDATION:
Screening mammogram in one year. (Code:51-O-LD2)

BI-RADS CATEGORY  1: Negative.

## 2023-04-24 ENCOUNTER — Other Ambulatory Visit: Payer: 59

## 2023-04-24 DIAGNOSIS — E785 Hyperlipidemia, unspecified: Secondary | ICD-10-CM

## 2023-04-24 DIAGNOSIS — R600 Localized edema: Secondary | ICD-10-CM

## 2023-04-24 DIAGNOSIS — R7303 Prediabetes: Secondary | ICD-10-CM

## 2023-04-24 DIAGNOSIS — Z0000 Encounter for general adult medical examination without abnormal findings: Secondary | ICD-10-CM

## 2023-04-25 LAB — LIPID PANEL
Chol/HDL Ratio: 4.1 ratio (ref 0.0–4.4)
Cholesterol, Total: 239 mg/dL — ABNORMAL HIGH (ref 100–199)
HDL: 59 mg/dL (ref >39)
LDL Chol Calc (NIH): 149 mg/dL — ABNORMAL HIGH (ref 0–99)
Triglycerides: 173 mg/dL — ABNORMAL HIGH (ref 0–149)
VLDL Cholesterol Cal: 31 mg/dL (ref 5–40)

## 2023-04-25 LAB — COMPREHENSIVE METABOLIC PANEL
ALT: 19 IU/L (ref 0–32)
AST: 26 IU/L (ref 0–40)
Albumin: 4.6 g/dL (ref 3.9–4.9)
Alkaline Phosphatase: 62 IU/L (ref 44–121)
BUN/Creatinine Ratio: 18 (ref 12–28)
BUN: 13 mg/dL (ref 8–27)
Bilirubin Total: 0.5 mg/dL (ref 0.0–1.2)
CO2: 25 mmol/L (ref 20–29)
Calcium: 10 mg/dL (ref 8.7–10.3)
Chloride: 100 mmol/L (ref 96–106)
Creatinine, Ser: 0.73 mg/dL (ref 0.57–1.00)
Globulin, Total: 2.8 g/dL (ref 1.5–4.5)
Glucose: 100 mg/dL — ABNORMAL HIGH (ref 70–99)
Potassium: 4.8 mmol/L (ref 3.5–5.2)
Sodium: 139 mmol/L (ref 134–144)
Total Protein: 7.4 g/dL (ref 6.0–8.5)
eGFR: 94 mL/min/{1.73_m2} (ref >59)

## 2023-04-25 LAB — HEMOGLOBIN A1C
Est. average glucose Bld gHb Est-mCnc: 123 mg/dL
Hgb A1c MFr Bld: 5.9 % — ABNORMAL HIGH (ref 4.8–5.6)

## 2023-12-05 ENCOUNTER — Other Ambulatory Visit: Payer: Self-pay | Admitting: Obstetrics and Gynecology

## 2023-12-05 DIAGNOSIS — N951 Menopausal and female climacteric states: Secondary | ICD-10-CM

## 2023-12-05 DIAGNOSIS — Z79890 Hormone replacement therapy (postmenopausal): Secondary | ICD-10-CM

## 2023-12-18 ENCOUNTER — Other Ambulatory Visit: Payer: Self-pay | Admitting: Obstetrics and Gynecology

## 2023-12-18 DIAGNOSIS — Z1231 Encounter for screening mammogram for malignant neoplasm of breast: Secondary | ICD-10-CM

## 2023-12-21 ENCOUNTER — Other Ambulatory Visit: Payer: Self-pay | Admitting: Obstetrics and Gynecology

## 2023-12-21 DIAGNOSIS — Z79890 Hormone replacement therapy (postmenopausal): Secondary | ICD-10-CM

## 2023-12-21 DIAGNOSIS — N951 Menopausal and female climacteric states: Secondary | ICD-10-CM

## 2023-12-31 ENCOUNTER — Ambulatory Visit
Admission: RE | Admit: 2023-12-31 | Discharge: 2023-12-31 | Disposition: A | Discharge status: Home / Self Care | Source: Ambulatory Visit | Attending: Obstetrics and Gynecology | Admitting: Obstetrics and Gynecology

## 2023-12-31 DIAGNOSIS — Z1231 Encounter for screening mammogram for malignant neoplasm of breast: Secondary | ICD-10-CM | POA: Diagnosis present

## 2024-01-02 ENCOUNTER — Ambulatory Visit: Payer: Self-pay | Admitting: Obstetrics and Gynecology

## 2024-01-06 ENCOUNTER — Ambulatory Visit: Admitting: Obstetrics and Gynecology

## 2024-01-10 ENCOUNTER — Telehealth: Payer: Self-pay

## 2024-01-10 NOTE — Telephone Encounter (Signed)
 Pt left msg on triage for ABC regarding high BP readings at home. Called her back to get more information. She states she was at an urgent care on Wednesday due to a tick bite. Was prescribed an abx and diagnosed with Lyme Disease. BP at urgent care was high, rechecked before leaving and it had gone down some. Has been checking it at home and has had readings of 150-160s (top #) and 95-107 (bottom #). Having headaches. She wanted to ask [facility] if she could just take BP medicine she had at home, hydrochlorothiazide  25 mg tablet. I advised pt [facility] out of the office until Tuesday. I advised pt to go back to urgent care. She wanted me to ask a provider for advise if possible on taking BP medicine she has at home. I was able to get a hold of Jerryl Morin and she is advising for pt to go to ER or Urgent Care. Called pt and she is aware.

## 2024-01-20 NOTE — Progress Notes (Signed)
 PCP: Pcp, No   Chief Complaint  Patient presents with   Gynecologic Exam    No concerns    HPI:      Kelly Welch is a 63 y.o. [phone] who LMP was No LMP recorded. Patient has had a hysterectomy., presents today for her annual examination.  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 She does not have PMB.  She does not have vasomotor sx on vivelle  dot 0.025 mg with sx relief, tried to wean off but vasomotor sx were bad. Needs Rx RF.  Sex activity: occas with single partner, contraception - status post hysterectomy. She does not have vaginal dryness/pain/bleeding.  Last Pap: 01/03/23  Results were: no abnormalities /neg HPV DNA. Still has cx. Hx of STDs: none  Last mammogram: 12/31/23  Results were: normal--routine follow-up in 12 months There is no FH of breast cancer. There is no FH of ovarian cancer. The patient does self-breast exams.   Colonoscopy: colonoscopy 6/23 at [facility] with polyp; repeat due after 5 yrs. 11/14 with Dr. Felicita Horns without abnormalities with repeat due after 5 years due to [facility] of polyps.   Tobacco use: The patient denies current or previous tobacco use. Alcohol use: occas No drug use Exercise: moderately active  She does get adequate calcium and Vitamin D supp in her diet.  Borderline lipids 2016-2023. Pre-DM on 12/17 labs (HgA1C=5.7%) and 2021, 2022;  normal in 2018, 2020, 2023; hx of gestational DM. Due for repeat labs this yr.   Pt also with anxiety issues due to being caregiver. Doesn't need daily meds, but takes ativan  very sparingly. Needs Rx RF.  She needs Rx RF on flonase  for allergies.  Pt had been on HCTZ 12.5 mg for LE edema/tinnitus for many yrs. Stopped it last yr and BP was fine until had tick bite and diagnosed with Lyme disease at [facility]; treated with doxy. Never had labs--no rash, just had fevers/chills/fatigue. Had elevated BP at that time, so restarted hydrochlorothiazide  25 mg with BP control.  Has weaned down to 12.5 mg dose and having low BP readings.   Doesn't have PCP.   Past Medical History:  Diagnosis Date   Arthritis    ASCUS of cervix with negative high risk HPV 04/29/2014   Chicken pox    Fallopian tube disorder 2013   focal epithelial hyperplasia of the tubes on pathology   Gestational diabetes    H/O cesarean section 93; 99; 02   x3   Hemorrhoids 06/2013   internal per colonoscopy   History of mammogram 9/15/15l 07/20/15   birad 2; benign   History of Papanicolaou smear of cervix 04/29/14; 07/20/15   ascus/neg; neg/neg   Irregular menses    Migraine     Past Surgical History:  Procedure Laterality Date   ABDOMINAL HYSTERECTOMY  07/01/2012   LSH C BIL TUBES REMOVED   CESAREAN SECTION     93; 99; 02   COLONOSCOPY  06/2013   DR. ELLIOTT; INTERNAL HEMORRHOIDS; RECK IN 5HRS D/T FAM HX   TUBAL LIGATION  2002    Family History  Problem Relation Age of Onset   Diabetes Mother    Hypertension Mother    Dementia Mother    Diabetes Father    Heart disease Father    Hypertension Father    Depression Maternal Grandmother    Breast cancer Neg Hx     Social History   Socioeconomic History  Marital status: Married    Spouse name: Not on file   Number of children: 3   Years of education: 14   Highest education level: Not on file  Occupational History   Occupation: HOMEMAKER  Tobacco Use   Smoking status: Never   Smokeless tobacco: Never  Vaping Use   Vaping status: Never Used  Substance and Sexual Activity   Alcohol use: Yes    Comment: OCC   Drug use: No   Sexual activity: Yes    Birth control/protection: Post-menopausal, Surgical    Comment: Hysterectomy  Other Topics Concern   Not on file  Social History Narrative   Not on file   Social Drivers of Health   Financial Resource Strain: Not on file  Food Insecurity: Not on file  Transportation Needs: Not on file  Physical Activity: Insufficiently Active (08/28/2017)   Exercise Vital  Sign    Days of Exercise per Week: 3 days    Minutes of Exercise per Session: 30 min  Stress: Stress Concern Present (08/28/2017)   [name] of Occupational Health - Occupational Stress Questionnaire    Feeling of Stress : Rather much  Social Connections: Moderately Integrated (08/28/2017)   Social Connection and Isolation Panel [NHANES]    Frequency of Communication with Friends and Family: More than three times a week    Frequency of Social Gatherings with Friends and Family: Never    Attends Religious Services: More than 4 times per year    Active Member of [employer] or Organizations: No    Attends Banker Meetings: Never    Marital Status: Married  Catering manager Violence: Not At Risk (08/28/2017)   Humiliation, Afraid, Rape, and Kick questionnaire    Fear of Current or Ex-Partner: No    Emotionally Abused: No    Physically Abused: No    Sexually Abused: No    Current Meds  Medication Sig   ascorbic acid (VITAMIN C) 1000 MG tablet Take by mouth.   cetirizine (ZYRTEC) 10 MG chewable tablet Chew by mouth.   Cholecalciferol (VITAMIN D3) 2000 units TABS Take 1 tablet by mouth daily.   hydrochlorothiazide  (HYDRODIURIL ) 25 MG tablet Take 0.5 tablets (12.5 mg total) by mouth daily.   hydrocortisone -pramoxine (ANALPRAM-HC) 2.5-1 % rectal cream Place rectally 3 (three) times daily.   ibuprofen (ADVIL,MOTRIN) 200 MG tablet Take by mouth.   Vitamins-Lipotropics (COMPLEX B-100-INOSITOL) TBCR Take 1 tablet by mouth.   [DISCONTINUED] DOTTI  0.025 MG/24HR APPLY 1 PATCH TOPICALLY TO SKIN TWICE A WEEK   [DISCONTINUED] fluticasone  (FLONASE ) 50 MCG/ACT nasal spray Place 2 sprays into both nostrils daily.   [DISCONTINUED] LORazepam  (ATIVAN ) 0.5 MG tablet Take 1-2 tabs BID prn sx      ROS:  Review of Systems  Constitutional:  Positive for fatigue. Negative for fever and unexpected weight change.  Respiratory:  Negative for cough, shortness of breath and wheezing.    Cardiovascular:  Negative for chest pain, palpitations and leg swelling.  Gastrointestinal:  Negative for blood in stool, constipation, diarrhea, nausea and vomiting.  Endocrine: Negative for cold intolerance, heat intolerance and polyuria.  Genitourinary:  Negative for dyspareunia, dysuria, flank pain, frequency, genital sores, hematuria, menstrual problem, pelvic pain, urgency, vaginal bleeding, vaginal discharge and vaginal pain.  Musculoskeletal:  Negative for arthralgias, back pain, joint swelling and myalgias.  Skin:  Positive for rash.  Neurological:  Positive for headaches. Negative for dizziness, syncope, light-headedness and numbness.  Hematological:  Negative for adenopathy.  Psychiatric/Behavioral:  Positive for agitation.  Negative for confusion, sleep disturbance and suicidal ideas. The patient is not nervous/anxious.      Objective: BP 128/83   Pulse 62   Ht 5\' 2"  (1.575 m)   Wt 190 lb (86.2 kg)   BMI 34.75 kg/m    Physical Exam Constitutional:      Appearance: She is well-developed.  Genitourinary:     Vulva normal.     Genitourinary Comments: UTERUS SURG REM     Right Labia: No rash, tenderness or lesions.    Left Labia: No tenderness, lesions or rash.    Vaginal cuff intact.    No vaginal discharge, erythema or tenderness.      Right Adnexa: not tender and no mass present.    Left Adnexa: not tender and no mass present.    Cervix is not parous or absent.     No cervical friability or polyp.     Uterus is not tender.     Uterus is absent.  Breasts:    Right: No mass, nipple discharge, skin change or tenderness.     Left: No mass, nipple discharge, skin change or tenderness.  Neck:     Thyroid: No thyromegaly.  Cardiovascular:     Rate and Rhythm: Normal rate and regular rhythm.     Heart sounds: Normal heart sounds. No murmur heard. Pulmonary:     Effort: Pulmonary effort is normal.     Breath sounds: Normal breath sounds.  Abdominal:      Palpations: Abdomen is soft.     Tenderness: There is no abdominal tenderness. There is no guarding or rebound.  Musculoskeletal:        General: Normal range of motion.     Cervical back: Normal range of motion.  Lymphadenopathy:     Cervical: No cervical adenopathy.  Neurological:     General: No focal deficit present.     Mental Status: She is alert and oriented to person, place, and time.     Cranial Nerves: No cranial nerve deficit.  Skin:    General: Skin is warm and dry.  Psychiatric:        Mood and Affect: Mood normal.        Behavior: Behavior normal.        Thought Content: Thought content normal.        Judgment: Judgment normal.  Vitals reviewed.     Assessment/Plan: Encounter for annual routine gynecological examination  Encounter for screening mammogram for malignant neoplasm of breast; pt current on mammo  Hormone replacement therapy (HRT) - Plan: estradiol  (DOTTI ) 0.025 MG/24HR  Vasomotor symptoms due to menopause - Plan: estradiol  (DOTTI ) 0.025 MG/24HR; doing well, Rx RF eRxd.   Anxiety - Plan: LORazepam  (ATIVAN ) 0.5 MG tablet; Rx RF, pt takes sparingly  Lyme disease - Plan: Lyme Disease Serology w/Reflex, Lyme Disease Serology w/Reflex; check labs, never done. Sx improved other than fatigue.   Blood tests for routine general physical examination - Plan: CBC with Differential/Platelet, Comprehensive metabolic panel with GFR, Hemoglobin A1c, Lipid panel, Lipid panel, Hemoglobin A1c, Comprehensive metabolic panel with GFR, CBC with Differential/Platelet  Pre-diabetes - Plan: Hemoglobin A1c, Hemoglobin A1c  Elevated lipids - Plan: Lipid panel, Lipid panel  Lower leg edema  Elevated blood pressure--with Lyme disease sx; BP low on 12.5 mg dose hydrochlorothiazide . Pt can d/c and keep BP journal. F/u pnr.   Environmental allergies - Plan: fluticasone  (FLONASE ) 50 MCG/ACT nasal spray; Rx RF    Meds ordered this encounter  Medications  estradiol  (DOTTI )  0.025 MG/24HR    Sig: Place 1 patch onto the skin 2 (two) times a week.    Dispense:  24 patch    Refill:  3    Supervising Provider:   ROBY, MICIA [[id number]]   LORazepam  (ATIVAN ) 0.5 MG tablet    Sig: Take 1-2 tabs BID prn sx    Dispense:  30 tablet    Refill:  0    Supervising Provider:   ROBY, MICIA [[id number]]   fluticasone  (FLONASE ) 50 MCG/ACT nasal spray    Sig: Place 2 sprays into both nostrils daily.    Dispense:  32 g    Refill:  5    Supervising Provider:   ROBY, MICIA [[id number]]           GYN counsel breast self exam, mammography screening, menopause, adequate intake of calcium and vitamin D, diet and exercise    F/U  Return in about 1 year (around 01/20/2025). Pt needs to find PCP.   Kelly Bayley B. Angelo Caroll, PA-C 01/21/2024 5:06 PM

## 2024-01-21 ENCOUNTER — Ambulatory Visit (INDEPENDENT_AMBULATORY_CARE_PROVIDER_SITE_OTHER): Admitting: Obstetrics and Gynecology

## 2024-01-21 ENCOUNTER — Encounter: Payer: Self-pay | Admitting: Obstetrics and Gynecology

## 2024-01-21 VITALS — BP 128/83 | HR 62 | Ht 62.0 in | Wt 190.0 lb

## 2024-01-21 DIAGNOSIS — F419 Anxiety disorder, unspecified: Secondary | ICD-10-CM

## 2024-01-21 DIAGNOSIS — Z9109 Other allergy status, other than to drugs and biological substances: Secondary | ICD-10-CM

## 2024-01-21 DIAGNOSIS — Z01419 Encounter for gynecological examination (general) (routine) without abnormal findings: Secondary | ICD-10-CM | POA: Diagnosis not present

## 2024-01-21 DIAGNOSIS — E785 Hyperlipidemia, unspecified: Secondary | ICD-10-CM

## 2024-01-21 DIAGNOSIS — Z1231 Encounter for screening mammogram for malignant neoplasm of breast: Secondary | ICD-10-CM

## 2024-01-21 DIAGNOSIS — A6920 Lyme disease, unspecified: Secondary | ICD-10-CM

## 2024-01-21 DIAGNOSIS — R600 Localized edema: Secondary | ICD-10-CM

## 2024-01-21 DIAGNOSIS — Z0000 Encounter for general adult medical examination without abnormal findings: Secondary | ICD-10-CM

## 2024-01-21 DIAGNOSIS — R7303 Prediabetes: Secondary | ICD-10-CM

## 2024-01-21 DIAGNOSIS — N951 Menopausal and female climacteric states: Secondary | ICD-10-CM

## 2024-01-21 DIAGNOSIS — Z79890 Hormone replacement therapy (postmenopausal): Secondary | ICD-10-CM

## 2024-01-21 MED ORDER — LORAZEPAM 0.5 MG PO TABS: ORAL_TABLET | ORAL | 0 refills | Status: AC

## 2024-01-21 MED ORDER — ESTRADIOL 0.025 MG/24HR TD PTTW: 1.0000 | MEDICATED_PATCH | TRANSDERMAL | 3 refills | Status: AC

## 2024-01-21 MED ORDER — FLUTICASONE PROPIONATE 50 MCG/ACT NA SUSP: 2.0000 | Freq: Every day | NASAL | 5 refills | Status: AC

## 2024-01-21 NOTE — Patient Instructions (Signed)
 I value your feedback and you entrusting [place] with your care. If you get a [place] patient survey, I would appreciate you taking the time to let us know about your experience today. Thank you! ? ? ?

## 2024-01-22 LAB — CBC WITH DIFFERENTIAL/PLATELET
Basophils Absolute: 0.1 10*3/uL (ref 0.0–0.2)
Basos: 1 %
EOS (ABSOLUTE): 0.2 10*3/uL (ref 0.0–0.4)
Eos: 2 %
Hematocrit: 40 % (ref 34.0–46.6)
Hemoglobin: 13.5 g/dL (ref 11.1–15.9)
Immature Grans (Abs): 0 10*3/uL (ref 0.0–0.1)
Immature Granulocytes: 0 %
Lymphocytes Absolute: 2.5 10*3/uL (ref 0.7–3.1)
Lymphs: 32 %
MCH: 32.4 pg (ref 26.6–33.0)
MCHC: 33.8 g/dL (ref 31.5–35.7)
MCV: 96 fL (ref 79–97)
Monocytes Absolute: 0.5 10*3/uL (ref 0.1–0.9)
Monocytes: 6 %
Neutrophils Absolute: 4.5 10*3/uL (ref 1.4–7.0)
Neutrophils: 59 %
Platelets: 258 10*3/uL (ref 150–450)
RBC: 4.17 x10E6/uL (ref 3.77–5.28)
RDW: 12.1 % (ref 11.7–15.4)
WBC: 7.8 10*3/uL (ref 3.4–10.8)

## 2024-01-22 LAB — COMPREHENSIVE METABOLIC PANEL WITH GFR
ALT: 25 IU/L (ref 0–32)
AST: 36 IU/L (ref 0–40)
Albumin: 4.6 g/dL (ref 3.9–4.9)
Alkaline Phosphatase: 77 IU/L (ref 44–121)
BUN/Creatinine Ratio: 21 (ref 12–28)
BUN: 13 mg/dL (ref 8–27)
Bilirubin Total: 0.5 mg/dL (ref 0.0–1.2)
CO2: 22 mmol/L (ref 20–29)
Calcium: 9.5 mg/dL (ref 8.7–10.3)
Chloride: 102 mmol/L (ref 96–106)
Creatinine, Ser: 0.63 mg/dL (ref 0.57–1.00)
Globulin, Total: 2.7 g/dL (ref 1.5–4.5)
Glucose: 93 mg/dL (ref 70–99)
Potassium: 4 mmol/L (ref 3.5–5.2)
Sodium: 140 mmol/L (ref 134–144)
Total Protein: 7.3 g/dL (ref 6.0–8.5)
eGFR: 101 mL/min/{1.73_m2} (ref >59)

## 2024-01-22 LAB — LIPID PANEL
Chol/HDL Ratio: 3.1 ratio (ref 0.0–4.4)
Cholesterol, Total: 204 mg/dL — ABNORMAL HIGH (ref 100–199)
HDL: 66 mg/dL (ref >39)
LDL Chol Calc (NIH): 119 mg/dL — ABNORMAL HIGH (ref 0–99)
Triglycerides: 107 mg/dL (ref 0–149)
VLDL Cholesterol Cal: 19 mg/dL (ref 5–40)

## 2024-01-22 LAB — HEMOGLOBIN A1C
Est. average glucose Bld gHb Est-mCnc: 114 mg/dL
Hgb A1c MFr Bld: 5.6 % (ref 4.8–5.6)

## 2024-01-22 LAB — LYME DISEASE SEROLOGY W/REFLEX: Lyme Total Antibody EIA: NEGATIVE

## 2024-01-23 ENCOUNTER — Ambulatory Visit: Payer: Self-pay | Admitting: Obstetrics and Gynecology

## 2024-02-04 ENCOUNTER — Encounter
